# Patient Record
Sex: Male | Born: 1945 | Race: White | Hispanic: No | Marital: Married | Smoking: Former smoker
Health system: Southern US, Community
[De-identification: ages and names within clinical notes are randomized; demographics above are authoritative.]

## PROBLEM LIST (undated history)

## (undated) DIAGNOSIS — R002 Palpitations: Secondary | ICD-10-CM

## (undated) DIAGNOSIS — K219 Gastro-esophageal reflux disease without esophagitis: Secondary | ICD-10-CM

## (undated) DIAGNOSIS — IMO0001 Reserved for inherently not codable concepts without codable children: Secondary | ICD-10-CM

## (undated) DIAGNOSIS — J439 Emphysema, unspecified: Secondary | ICD-10-CM

## (undated) DIAGNOSIS — T7840XA Allergy, unspecified, initial encounter: Secondary | ICD-10-CM

## (undated) DIAGNOSIS — E039 Hypothyroidism, unspecified: Secondary | ICD-10-CM

## (undated) DIAGNOSIS — M810 Age-related osteoporosis without current pathological fracture: Secondary | ICD-10-CM

## (undated) DIAGNOSIS — J45909 Unspecified asthma, uncomplicated: Secondary | ICD-10-CM

## (undated) DIAGNOSIS — E785 Hyperlipidemia, unspecified: Secondary | ICD-10-CM

## (undated) DIAGNOSIS — C801 Malignant (primary) neoplasm, unspecified: Secondary | ICD-10-CM

## (undated) DIAGNOSIS — R011 Cardiac murmur, unspecified: Secondary | ICD-10-CM

## (undated) HISTORY — DX: Emphysema, unspecified: J43.9

## (undated) HISTORY — PX: PROSTATECTOMY: SHX69

## (undated) HISTORY — DX: Cardiac murmur, unspecified: R01.1

## (undated) HISTORY — DX: Palpitations: R00.2

## (undated) HISTORY — DX: Hyperlipidemia, unspecified: E78.5

## (undated) HISTORY — PX: FINGER SURGERY: SHX640

## (undated) HISTORY — PX: COLONOSCOPY: SHX174

## (undated) HISTORY — DX: Allergy, unspecified, initial encounter: T78.40XA

## (undated) HISTORY — PX: OTHER SURGICAL HISTORY: SHX169

## (undated) HISTORY — DX: Age-related osteoporosis without current pathological fracture: M81.0

## (undated) HISTORY — PX: VASECTOMY: SHX75

## (undated) HISTORY — PX: ELBOW SURGERY: SHX618

---

## 2002-07-20 ENCOUNTER — Ambulatory Visit (HOSPITAL_COMMUNITY): Admission: RE | Admit: 2002-07-20 | Discharge: 2002-07-20 | Payer: Self-pay | Admitting: Pulmonary Disease

## 2003-07-22 ENCOUNTER — Ambulatory Visit (HOSPITAL_COMMUNITY): Admission: RE | Admit: 2003-07-22 | Discharge: 2003-07-22 | Payer: Self-pay | Admitting: Cardiology

## 2004-01-20 ENCOUNTER — Ambulatory Visit (HOSPITAL_COMMUNITY): Admission: RE | Admit: 2004-01-20 | Discharge: 2004-01-20 | Payer: Self-pay | Admitting: Internal Medicine

## 2004-07-11 DIAGNOSIS — C4491 Basal cell carcinoma of skin, unspecified: Secondary | ICD-10-CM

## 2004-07-11 HISTORY — DX: Basal cell carcinoma of skin, unspecified: C44.91

## 2005-09-28 ENCOUNTER — Ambulatory Visit (HOSPITAL_COMMUNITY): Admission: RE | Admit: 2005-09-28 | Discharge: 2005-09-28 | Payer: Self-pay | Admitting: Pulmonary Disease

## 2005-10-22 HISTORY — PX: ESOPHAGOGASTRODUODENOSCOPY (EGD) WITH ESOPHAGEAL DILATION: SHX5812

## 2006-04-15 ENCOUNTER — Emergency Department (HOSPITAL_COMMUNITY): Admission: EM | Admit: 2006-04-15 | Discharge: 2006-04-15 | Payer: Self-pay | Admitting: Emergency Medicine

## 2006-04-16 ENCOUNTER — Ambulatory Visit: Payer: Self-pay | Admitting: Internal Medicine

## 2006-04-16 ENCOUNTER — Ambulatory Visit (HOSPITAL_COMMUNITY): Admission: RE | Admit: 2006-04-16 | Discharge: 2006-04-16 | Payer: Self-pay | Admitting: Internal Medicine

## 2007-02-03 ENCOUNTER — Ambulatory Visit (HOSPITAL_COMMUNITY): Admission: RE | Admit: 2007-02-03 | Discharge: 2007-02-03 | Payer: Self-pay | Admitting: Unknown Physician Specialty

## 2007-05-02 ENCOUNTER — Ambulatory Visit (HOSPITAL_COMMUNITY): Admission: RE | Admit: 2007-05-02 | Discharge: 2007-05-02 | Payer: Self-pay | Admitting: Pulmonary Disease

## 2007-06-30 IMAGING — CR DG CHEST 2V
3 series · 3 of 3 positions shown · non-contrast
Comparison: none

HISTORY: Dyspnea, COPD, asthma

CHEST 2 VIEWS:
No prior exams currently available for comparison
Normal heart size, mediastinal contours, and vascularity.
Changes of COPD and bronchitis.
No infiltrate or effusion.
No evidence of pulmonary mass.
Spur formation throughout thoracic spine.

[view not recorded (1 of 3)]
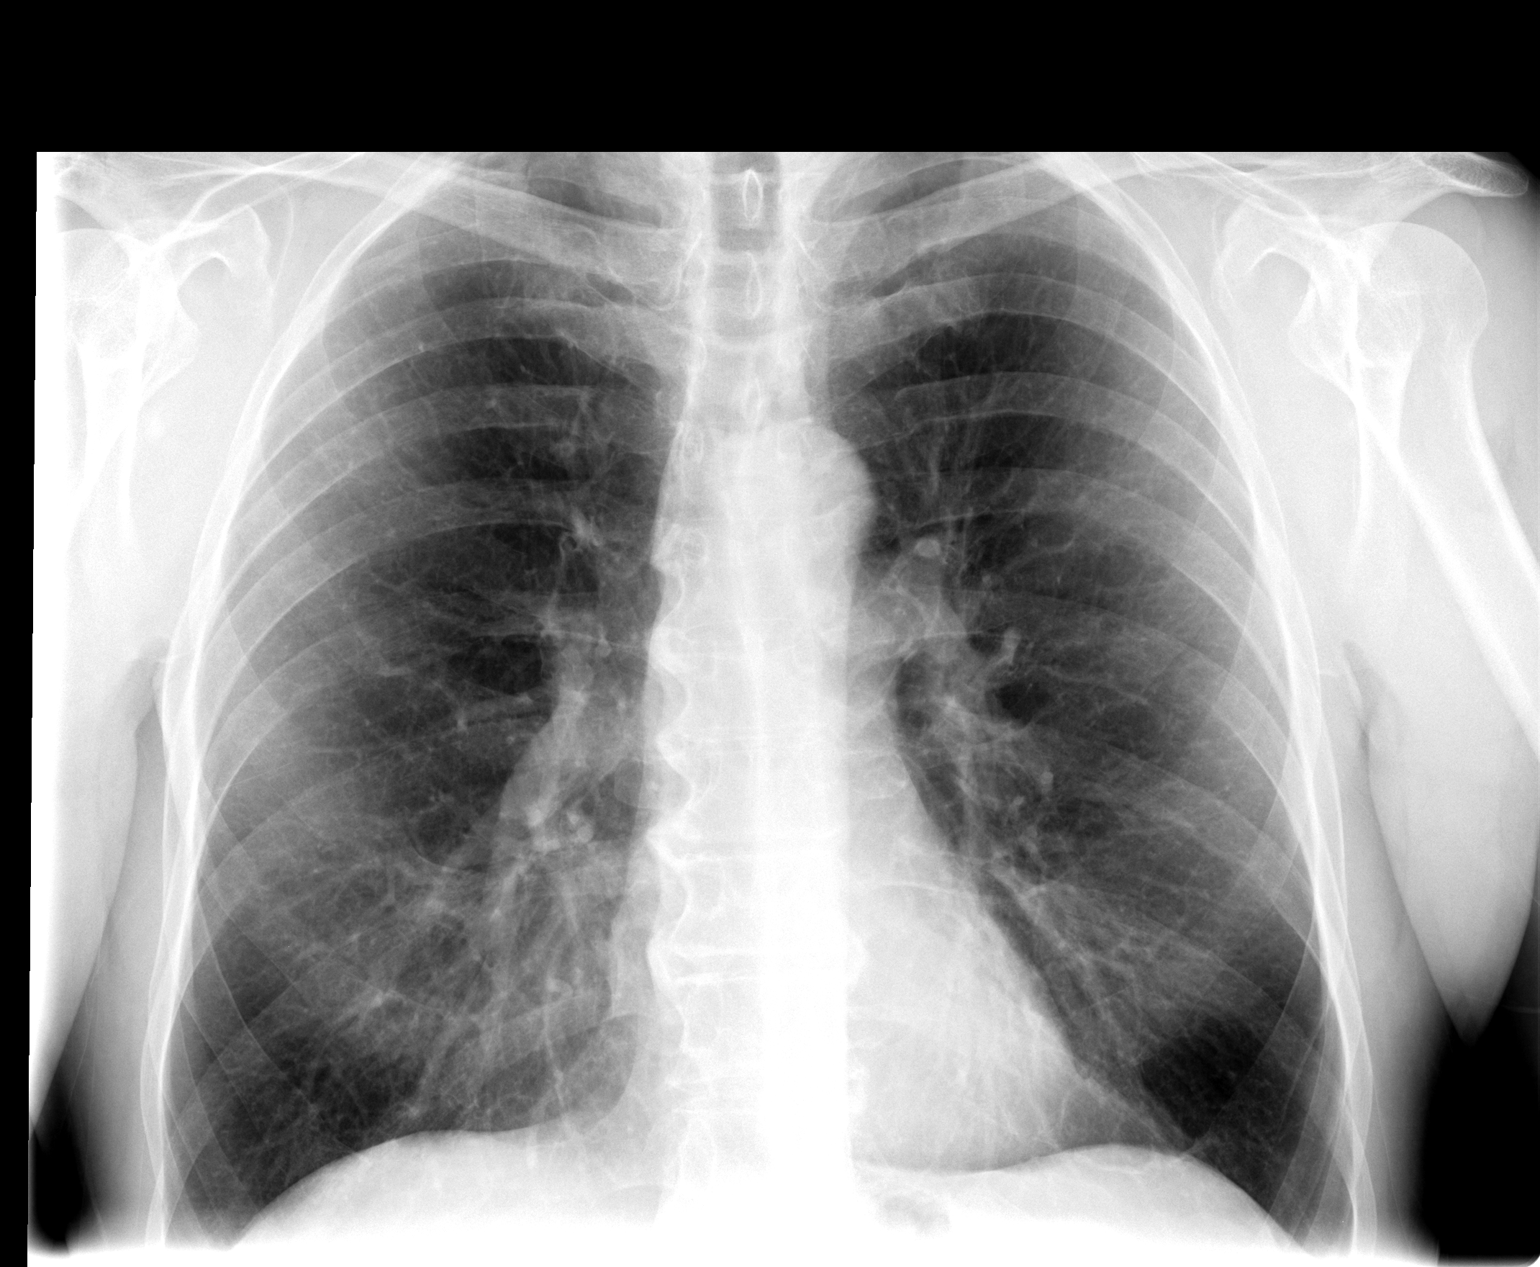

[view not recorded (2 of 3)]
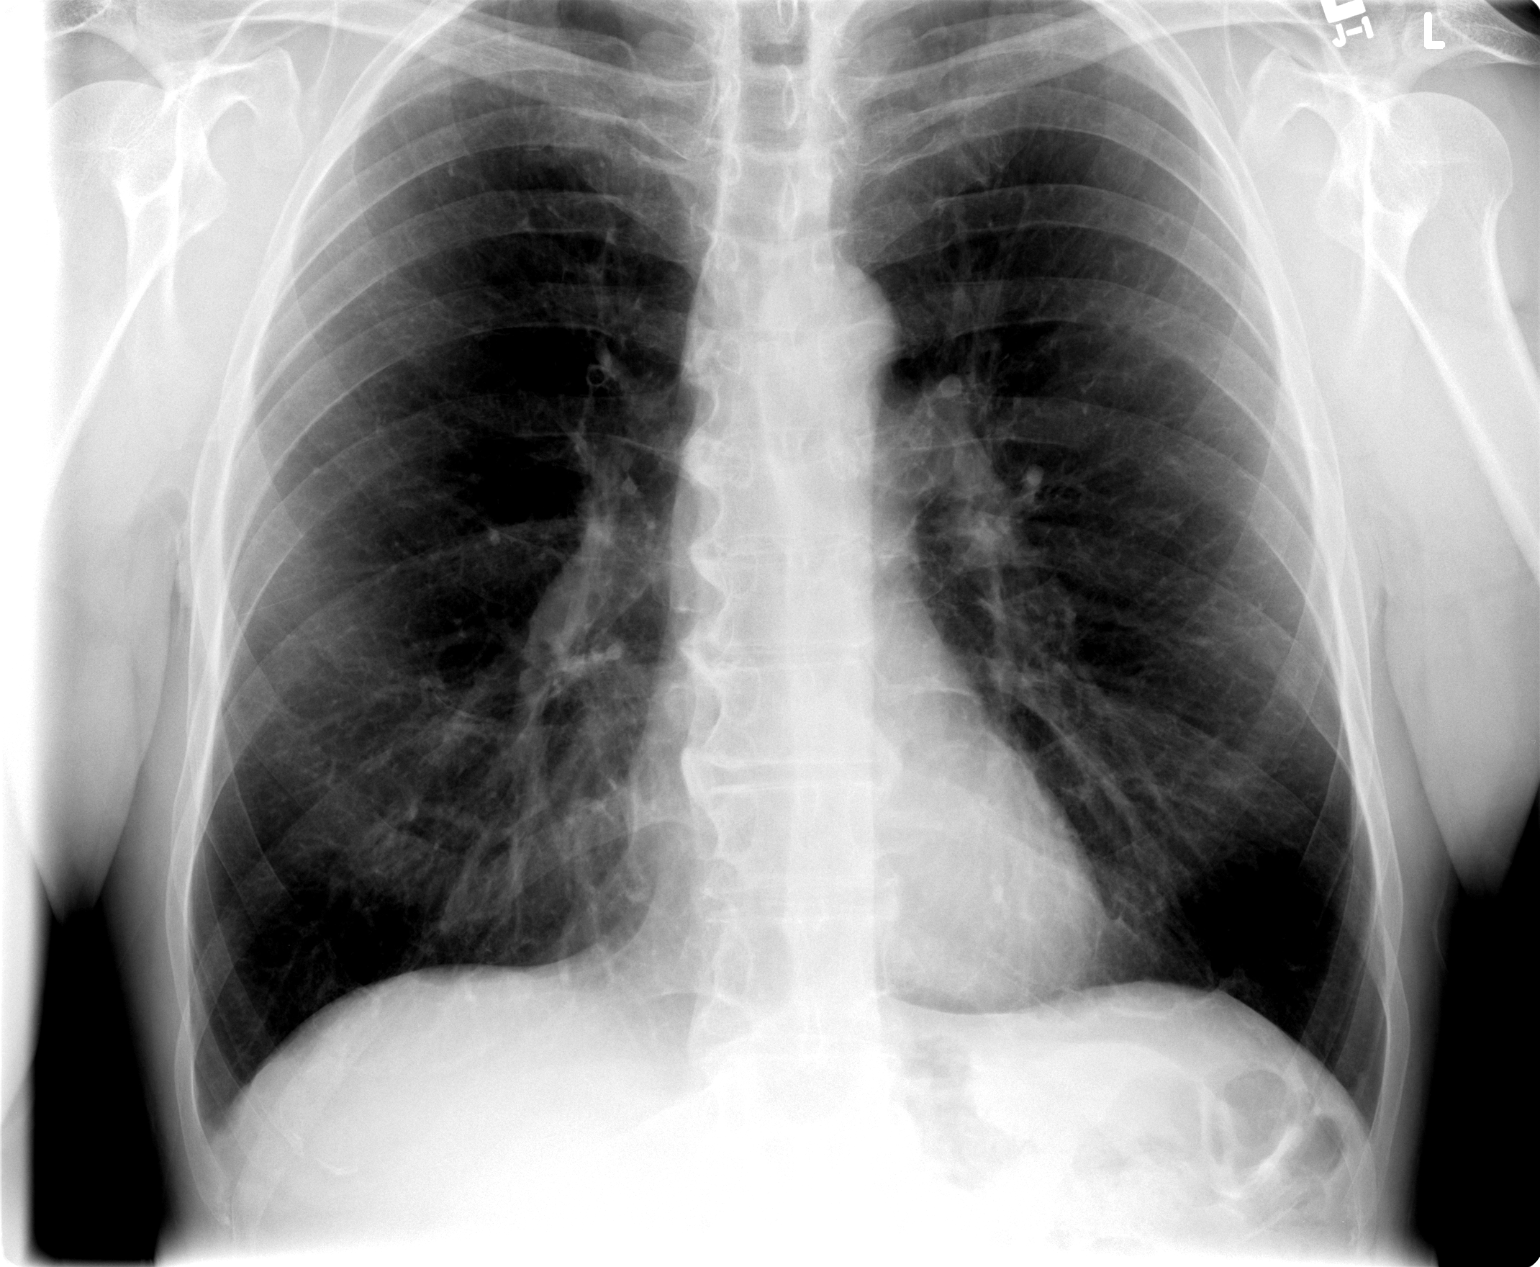

[view not recorded (3 of 3)]
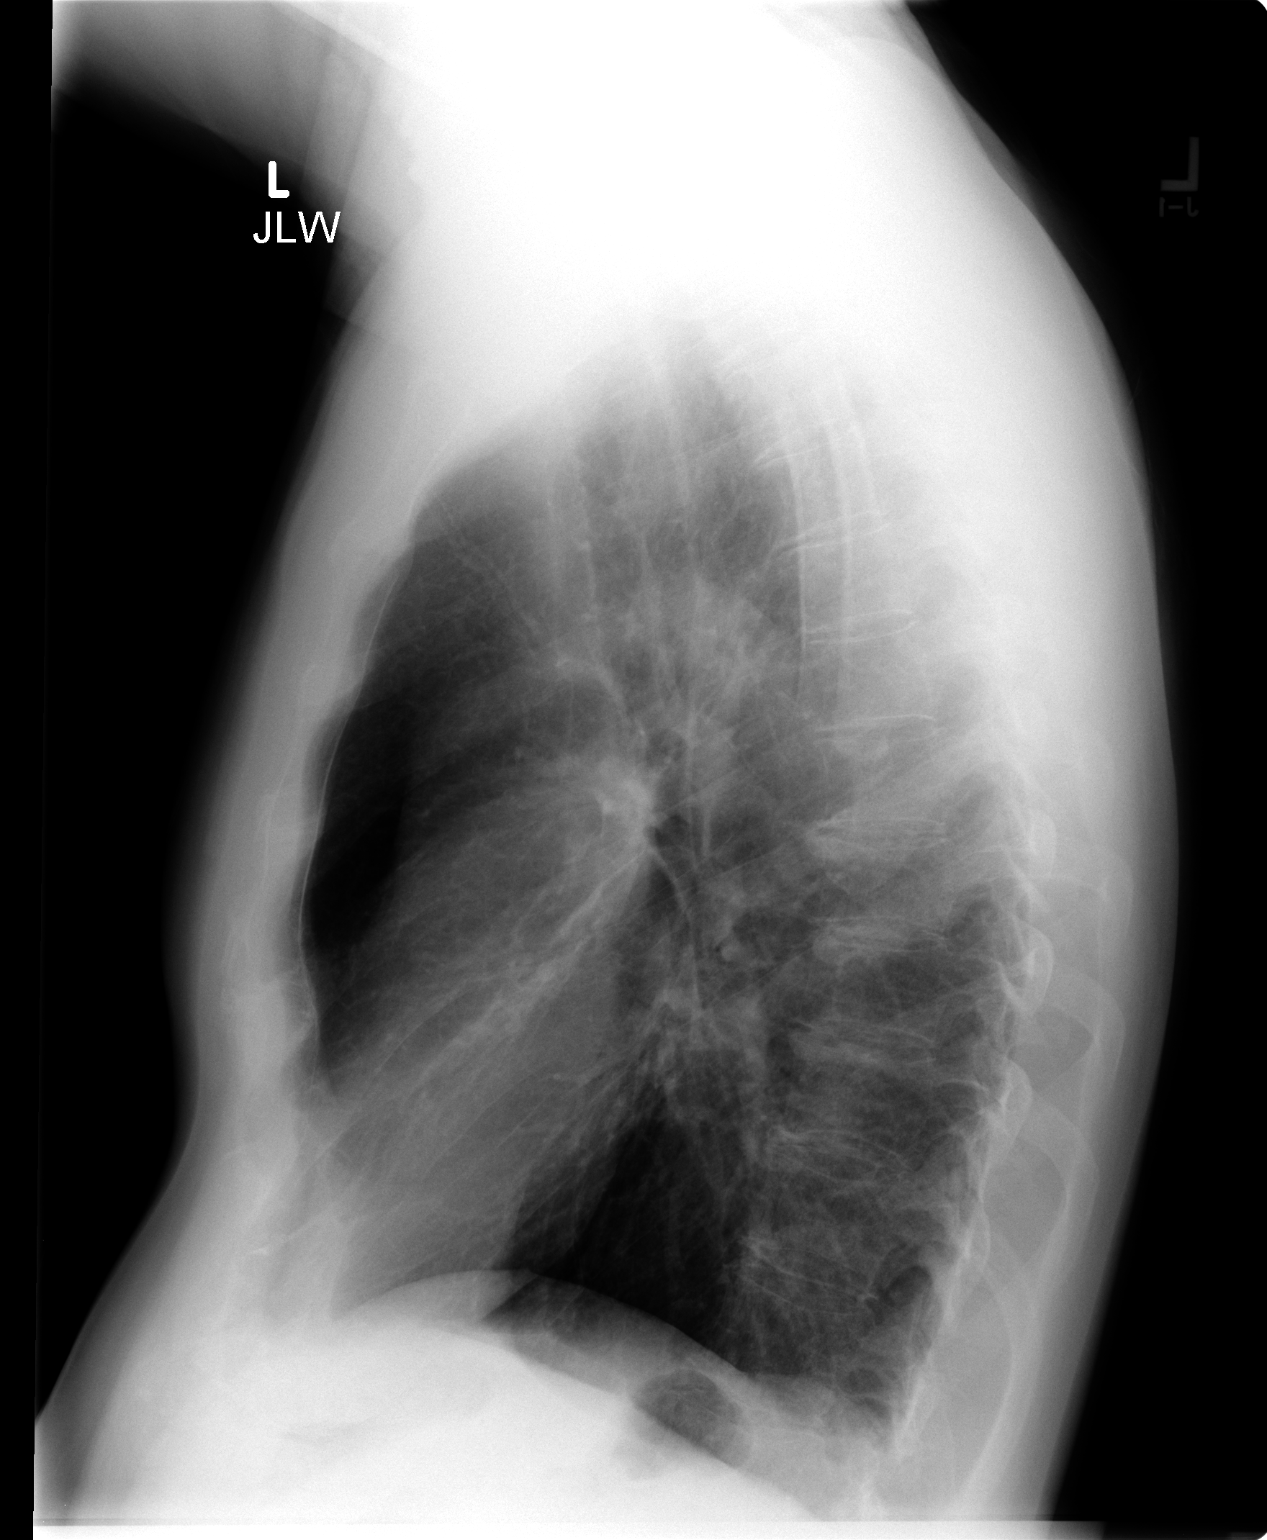

[3 of 3 positions shown; findings below may reference images not displayed]

IMPRESSION: COPD and bronchitic changes without acute abnormality.

## 2007-12-31 ENCOUNTER — Ambulatory Visit (HOSPITAL_COMMUNITY): Admission: RE | Admit: 2007-12-31 | Discharge: 2007-12-31 | Payer: Self-pay | Admitting: Unknown Physician Specialty

## 2009-03-08 ENCOUNTER — Ambulatory Visit: Payer: Self-pay | Admitting: Cardiology

## 2009-03-09 ENCOUNTER — Encounter: Payer: Self-pay | Admitting: Cardiology

## 2009-03-09 ENCOUNTER — Ambulatory Visit: Payer: Self-pay | Admitting: Cardiology

## 2009-03-09 ENCOUNTER — Ambulatory Visit (HOSPITAL_COMMUNITY): Admission: RE | Admit: 2009-03-09 | Discharge: 2009-03-09 | Payer: Self-pay | Admitting: Cardiology

## 2009-03-09 ENCOUNTER — Ambulatory Visit (HOSPITAL_COMMUNITY): Admission: RE | Admit: 2009-03-09 | Discharge: 2009-03-09 | Payer: Self-pay | Admitting: Pulmonary Disease

## 2009-03-14 ENCOUNTER — Telehealth: Payer: Self-pay | Admitting: Cardiology

## 2009-04-01 ENCOUNTER — Ambulatory Visit: Admission: RE | Admit: 2009-04-01 | Discharge: 2009-05-24 | Payer: Self-pay | Admitting: Radiation Oncology

## 2009-04-02 ENCOUNTER — Ambulatory Visit: Payer: Self-pay | Admitting: Cardiology

## 2009-04-05 DIAGNOSIS — E785 Hyperlipidemia, unspecified: Secondary | ICD-10-CM | POA: Insufficient documentation

## 2009-04-05 DIAGNOSIS — Z8639 Personal history of other endocrine, nutritional and metabolic disease: Secondary | ICD-10-CM

## 2009-04-05 DIAGNOSIS — Z8679 Personal history of other diseases of the circulatory system: Secondary | ICD-10-CM | POA: Insufficient documentation

## 2009-04-05 DIAGNOSIS — Z862 Personal history of diseases of the blood and blood-forming organs and certain disorders involving the immune mechanism: Secondary | ICD-10-CM | POA: Insufficient documentation

## 2009-04-06 ENCOUNTER — Ambulatory Visit: Payer: Self-pay | Admitting: Cardiology

## 2009-05-12 ENCOUNTER — Inpatient Hospital Stay (HOSPITAL_COMMUNITY): Admission: RE | Admit: 2009-05-12 | Discharge: 2009-05-14 | Payer: Self-pay | Admitting: Urology

## 2009-05-12 ENCOUNTER — Encounter (INDEPENDENT_AMBULATORY_CARE_PROVIDER_SITE_OTHER): Payer: Self-pay | Admitting: Urology

## 2011-01-28 LAB — HEMOGLOBIN AND HEMATOCRIT, BLOOD
HCT: 38.3 % — ABNORMAL LOW (ref 39.0–52.0)
Hemoglobin: 13.1 g/dL (ref 13.0–17.0)

## 2011-01-28 LAB — BASIC METABOLIC PANEL
Calcium: 9.5 mg/dL (ref 8.4–10.5)
GFR calc Af Amer: >60 mL/min (ref >60)
GFR calc non Af Amer: >60 mL/min (ref >60)
Sodium: 141 mEq/L (ref 135–145)

## 2011-01-28 LAB — TYPE AND SCREEN: Antibody Screen: NEGATIVE

## 2011-01-28 LAB — CBC
Hemoglobin: 14.2 g/dL (ref 13.0–17.0)
RBC: 4.54 MIL/uL (ref 4.22–5.81)
RDW: 13.3 % (ref 11.5–15.5)

## 2011-03-06 NOTE — Discharge Summary (Signed)
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   PROCEDURES:  1. Robotic assisted laparoscopic radical prostatectomy (bilateral      nerve sparing).  2. Bilateral laparoscopic pelvic lymphadenectomy.   HISTORY AND PHYSICAL:  For full details, please see admission history  and physical.  Briefly, [name] is a 65 year old gentleman who was  found to have clinically localized adenocarcinoma of the prostate.  After careful consideration regarding management options for treatment,  he elected to proceed with surgical therapy and a robotic assisted  laparoscopic radical prostatectomy.   HOSPITAL COURSE:  On May 12, 2009, he was taken to the operating room  where he underwent the above-named procedure in which he it tolerated  well and without complications.  Postoperatively, he was able to be  transferred to a regular hospital room following recovery from  anesthesia.  He was able to begin ambulation that evening.  He remained  hemodynamically stable.  His postoperative hematocrit was 38.3.  On the  morning of postoperative day #1, his hematocrit was also found to be  stable at 35.5.  He maintained excellent urine output with minimal  output from his pelvic drain.  Therefore, the pelvic drain was removed.  He was placed on a clear liquid diet and continued to ambulate.  He was  also started on oral pain medications.  He was reevaluated on the  afternoon of  postoperative day #1.  His urine output, blood pressure and  pulse all remained stable.  He was complaining of moderate to severe  right incisional pain not relieved with oral pain medications.  His oral  pain medication was changed, and it was decided that he would stay  overnight for observation of this pain.  On postoperative day #2, he was  reevaluated.  Only mild to moderate pain which was relieved with his  oral pain medications.  Therefore, he was felt to be stable for  discharge, as he had met all discharge criteria.   DISPOSITION:  Home.   DISCHARGE MEDICATIONS:  He was instructed to resume his regular home  medications including:  1. Symbicort.  2. Vitamin D.  3. Niacin.  4. Zetia.  5. Albuterol inhaler.  In addition, he was provided a prescription for:  1. Percocet to take as needed for pain.  2. Told to use Colace as a stool softener.  3. He was given a prescription for Cipro to begin 1 day prior to      return visit for removal of Foley catheter.   DISCHARGE INSTRUCTIONS:  He was instructed to be  ambulatory but  specifically told to refrain from any heavy lifting, strenuous activity  or driving.  He was instructed on routine Foley catheter care and told  to gradually advance his diet over the course of the next several days.   FOLLOW UP:  He will follow up in 1 week for removal of Foley catheter  and skin staples.      Delia Chimes, NP      Heloise Purpura, MD  Electronically Signed    MA/MEDQ  D:  05/17/2009  T:  05/17/2009  Job:  [id number]

## 2011-03-06 NOTE — Op Note (Signed)
NAME:  MARQUIE, ADERHOLD NO.:  [id number]   MEDICAL RECORD NO.:  [id number]          PATIENT TYPE:  INP   LOCATION:  1429                         FACILITY:  [facility]   PHYSICIAN:  Heloise Purpura, MD      DATE OF BIRTH:  1946/02/24   DATE OF PROCEDURE:  05/12/2009  DATE OF DISCHARGE:                               OPERATIVE REPORT   PREOPERATIVE DIAGNOSIS:  Clinically localized adenocarcinoma of the  prostate (clinical stage T1C NX MX).   POSTOPERATIVE DIAGNOSIS:  Clinically localized adenocarcinoma of the  prostate (clinical stage T1C NX MX).   PROCEDURES:  1. Robotic assisted laparoscopic radical prostatectomy (bilateral      nerve sparing).  2. Bilateral laparoscopic pelvic lymphadenectomy.   SURGEON:  Dr. Heloise Purpura.   FIRST ASSISTANT:  Delia Chimes, nurse practitioner.   SECOND ASSISTANT:  Edward Qualia, MD.   ANESTHESIA:  General.   COMPLICATIONS:  None.   ESTIMATED BLOOD LOSS:  150 mL.   SPECIMENS:  1. Prostate and seminal vesicles.  2. Right pelvic lymph nodes.  3. Left pelvic lymph nodes.   DISPOSITION:  Specimens to pathology.   DRAINS:  1. 20-French coude catheter.  2. #19 Blake pelvic drain.   INDICATIONS:  Mr. Ching is a 65 year old gentleman with recently  diagnosed clinically localized adenocarcinoma of the prostate.  After  discussion regarding management options for treatment, he elected to  proceed with surgical therapy and the above procedures.  The potential  risks, complications, and alternative options were discussed in detail  and informed consent was obtained.   DESCRIPTION OF PROCEDURE:  The patient was taken to the operating room  and a general anesthetic was administered.  He was given preoperative  antibiotics, placed in the dorsal lithotomy position, and prepped and  draped in the usual sterile fashion.  Next, a preoperative timeout was  performed.  A Foley catheter was then inserted into the bladder and a  site  was selected just superior to the umbilicus for placement of the  camera port.  This was placed using a standard open Hassan technique  which allowed entry into the peritoneal cavity under direct vision and  without difficulty.  The surgical cart was then docked.  With the aid of  the cautery scissors, the bladder was reflected posteriorly allowing  entry into the space of Retzius and identification of the endopelvic  fascia and prostate.  The endopelvic fascia was then incised from the  apex back to the base of the prostate bilaterally and the underlying  levator muscle fibers were swept laterally off the prostate, thereby  isolating the dorsal venous complex.  The dorsal vein was then stapled  and divided with a 45 mm Flex ETS stapler.  The bladder neck was  identified with the aid of Foley catheter manipulation and was divided  anteriorly, thereby exposing the Foley catheter.  The catheter balloon  was then brought into the operative field after the balloon was deflated  and used to retract the prostate anteriorly.  The posterior bladder neck  was then examined.  There was no evidence of a median lobe.  The  posterior bladder neck was divided and dissection proceeded between the  bladder and prostate until the vasa deferentia and seminal vesicles were  identified.  The vasa deferentia were divided and ligated with Hem-o-lok  clips.  The seminal vesicles were then dissected down to their tips with  care to control the seminal vesicle arterial blood supply with Hem-o-lok  clips.  These structures were then lifted anteriorly and the space  between Denonvilliers fascia and the anterior rectum was bluntly  developed.  This isolated the vascular pedicles of the prostate.  The  lateral prostatic fascia was then incised bilaterally allowing the  neurovascular bundles to be released bilaterally.  The vascular pedicles  of the prostate were then ligated with Hem-o-lok clips above the level  of  the neurovascular bundle and divided with sharp cold scissor  dissection.  The neurovascular bundles were then swept off the apex of  prostate and urethra.  The urethra was sharply transected allowing the  prostate specimen to be disarticulated.  The pelvis was then copiously  irrigated and hemostasis was ensured.  There was no evidence for a  rectal injury.  Attention then turned to the right pelvic sidewall.  The  fibrofatty tissue between the external iliac vein, confluence of the  iliac vessels, hypogastric artery, and Cooper's ligament was dissected  free from the pelvic sidewall with care to preserve the obturator nerve.  Hem-o-lok clips were used for lymphostasis and hemostasis.  An identical  procedure was then performed on the contralateral side and both  lymphatic packets were removed for permanent pathologic analysis.  Attention then turned to the urethral anastomosis.  A 2-0 Vicryl slip-  knot was placed between Denonvilliers fascia, the posterior bladder neck  and the posterior urethra to reapproximate these structures.  A double-  armed 3-0 Monocryl suture was then used to perform a 360 degree running  tension-free anastomosis between the bladder neck and urethra.  A new 20-  [place] coude catheter was inserted into the bladder and irrigated.  The  anastomosis appeared to be watertight and without blood clots within the  bladder.  There was noted to be some oozing from the right neurovascular  bundle and 5 mL of FloSeal was placed into the operative field to help  control this oozing and this did result in excellent hemostasis.  The  pneumoperitoneum was let down and hemostasis continued to be excellent.  A #19 Blake drain was then brought through the left robotic port and  appropriately positioned in the pelvis.  It was secured to the skin with  a nylon suture.  The surgical cart was then undocked.  The right lateral  12 mm port site was closed with a 0 Vicryl suture placed  with the aid of  the suture passer device.  The prostate specimen was then removed intact  within the Endopouch retrieval bag via the periumbilical port site.  All  port sites were then injected with quarter-percent Marcaine and  reapproximated at the skin level with staples.  Sterile dressings were  applied.  He appeared to tolerate the procedure well and without  complications.  He was able to be extubated and transferred to the  recovery unit in satisfactory condition.      Heloise Purpura, MD  Electronically Signed     LB/MEDQ  D:  05/12/2009  T:  05/12/2009  Job:  [id number]

## 2011-03-06 NOTE — Assessment & Plan Note (Signed)
[facility]                       [facility] OFFICE NOTE   TYNER, CODNER                   MRN:          [id number]  DATE:03/08/2009                            DOB:          Mar 06, 1946    CHIEF COMPLAINT:  Palpitations.   HISTORY OF PRESENT ILLNESS:  I was asked by Dr. 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  I evaluated him in 2004 for some dyspnea on exertion.  Please refer to that note July 19, 2003, as well as a 2-D  echocardiogram that was done on July 22, 2003.  The echo showed  mild mitral valve thickening with borderline prolapse, but no  regurgitation.  He has some mild aortic valve sclerosis.  He had normal  right-sided function and normal left-sided function.  At that time,  endocarditis prophylaxis was recommended.   Over the past several weeks, he has noted increased palpitations.  He  had 1 episode one evening.  He thought he had a unhealthy heart.  He  saw Dr. Juanetta Gosling the next day who did an EKG which was normal.  Since  that time, he has cut back on his Symbicort to 1 puff b.i.d. and also  has eliminated wine about 1 glass of night.  He says eliminating the  wine seems to have helped the most.   He is on Synthroid thyroid replacement with a normal TSH recently.  He  takes niacin over the counter, but he takes this in the morning and at  lunch.  He has no side effects from this.   He is still very active biking.  He does not notice palpitations with  exercise.  He has had no presyncope or syncope.  He wears a heart  monitor when he rides and heart rate runs in the mid 90s.   He has a history of exercise-induced asthma and takes Symbicort and  p.r.n. Ventolin.  He does not take much Ventolin however.   PAST MEDICAL HISTORY:   CURRENT MEDICATIONS:  1. Zetia 10 mg a day.  2. Niacin 500 mg b.i.d.  3. Symbicort 160/4.5 one puff  b.i.d.  4. Boniva IV q.3 months.  5. Vitamin D 1000 units b.i.d.  6. Synthroid 200 mcg a day.  7. Ventolin p.r.n.  8. Flonase p.r.n..   As mentioned above, he has a history of asthma and he has had some COPD  changes on chest x-rays.  He is not a smoker.   His previous surgeries could include tennis elbow and trigger finger.  He has also had a vasectomy.   ALLERGIES:  MYCIN DRUGS and STATINS.   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  He enjoys  swimming and biking.  He is married and has 2 children.   FAMILY HISTORY:  Negative for premature coronary artery disease or  sudden death.   REVIEW OF SYSTEMS:  Other than HPI, he wears eyeglasses.  He had an  ulcer some 35 years ago of the stomach.  Otherwise negative other than  Synthroid replacement for hypothyroidism.  He has  mild hyperlipidemia on  Zetia.   The other review of systems are negative.  Please refer to the  diagnostic evaluation form.   PHYSICAL EXAMINATION:  GENERAL:  A very pleasant gentleman in no acute  distress.  Looks physically fit.  He is 6 feet tall, weighs 176 pounds.  VITAL SIGNS:  Blood pressure 120/74 in the right arm, pulse is 57 and  regular.  HEENT:  Normal.  Dentition is in good shape.  NECK:  Carotid upstrokes were equal bilaterally without bruits.  There  is no JVD.  Thyroid is not enlarged.  Trachea is midline.  No  lymphadenopathy.  Neck is supple.  LUNGS:  Clear to auscultation and percussion with no wheezes or rhonchi.  HEART:  A nondisplaced PMI.  He has a split S1, no obvious click, and no  significant murmur, S2 splits physiologically.  ABDOMEN:  Soft, good  bowel sounds.  There is no pulsatile mass.  There is no organomegaly.  EXTREMITIES:  No cyanosis, clubbing, or edema.  Pulses are brisk.  NEURO:  Intact.  No sign of DVT.   His electrocardiogram shows sinus bradycardia with question of left  atrial enlargement.  His PR, QRS, and QTC intervals are normal.   Laboratory data from Dr. Juanetta Gosling office on February 14, 2009, showed a  potassium of 4.3, TSH was normal, and calcium is normal.   ASSESSMENT:  Palpitations, which are most likely benign.  It could be  related to his mild mitral valve prolapse.  He seems to note an  association with wine which I told him was very unusual just 1 glass a  day.  TSH was recently checked and his euthyroid.  He has cut back on  the Symbicort which could have helped as well.  He uses Ventolin very  little.   RECOMMENDATIONS:  A 2-D echocardiogram to reassess his mitral valve  prolapse.  I doubt he has mitral regurgitation.  I have told that he no  longer needs SBE prophylaxis.  I have  reinforced his good healthy therapeutic lifestyle choices.  I will see  him back on a p.r.n. basis if his echo is stable.     Omkar C. Daleen Squibb, MD, [facility]  Electronically Signed    TCW/MedQ  DD: 03/08/2009  DT: 03/09/2009  Job #: [id number]   cc:   Ramon Dredge L. Juanetta Gosling, M.D.

## 2011-03-06 NOTE — Assessment & Plan Note (Signed)
[facility]                       [facility] OFFICE NOTE   Nikolis, Berent LIOR HOEN                   MRN:          [id number]  DATE:04/06/2009                            DOB:          Nov 11, 1945    Walter Taylor comes today to get surgical clearance for a radical  prostatectomy for a Gleason stage 6-7.  This will be robotic and will be  done in [place].  He will require general anesthesia.   He saw me recently for some palpitations and a history of mitral valve  prolapse.  His event recorder which he is still wearing shows mostly  sinus bradycardia going down into the 30s at night.  He is athletic,  rides a bike, and has a lot of vagal tone.  He had significant sinus  arrhythmia which is also sign of healthy vagal tone.  He had occasional  palpitations which correlate with PACs.  There was a rare unusual PVC.  There was no atrial fib or other complex arrhythmia.  A 2-D  echocardiogram basically showed a normal study.  There was no mention of  mitral valve prolapse and the structural integrity of the valve was  normal.  He had trivial regurgitation.  He had normal left ventricular  function as well as normal right-sided function.   He is feeling better overall.  His meds are unchanged, and he is really  only on Zetia 10 mg day, niacin 500 b.i.d., and his Synthroid.  His TSH  has been normal.  He does use Entocort and Ventolin p.r.n. but has tried  to limit the Ventolin.   His exam is blood pressure 124/68, his pulse 60 and regular.  His weight  is 174.  HEENT is normal.  Heart/lungs unchanged.  Abdominal exam is  soft.  Extremities reveal no edema.  Pulses are unchanged.   I had a long talk with Mr. Goddard today.  I have reviewed all of his  event monitor recordings and also have talked to him at length about his  echocardiographic findings.  He is low risk from my standpoint for  surgery.  I will send a copy of this note and go  ahead and approve him  for surgery.  We will see him back in a year or p.r.n.     Skylen C. Daleen Squibb, MD, [facility]  Electronically Signed    TCW/MedQ  DD: 04/06/2009  DT: 04/07/2009  Job #: [id number]   cc:   Heloise Purpura, MD

## 2011-03-09 NOTE — Procedures (Signed)
   NAME:  Walter Taylor, Walter Taylor                      ACCOUNT NO.:  [id number]   MEDICAL RECORD NO.:  [id number]                   PATIENT TYPE:  OUT   LOCATION:  RAD                                  FACILITY:  [facility]   PHYSICIAN:  [place] Bing, M.D.               DATE OF BIRTH:  October 07, 1946   DATE OF PROCEDURE:  07/22/2003  DATE OF DISCHARGE:                                  ECHOCARDIOGRAM   REFERRING PHYSICIANS:  Oneal Deputy. Juanetta Gosling, M.D. and Jesse Sans. Wall, M.D.   CLINICAL DATA:  A 65 year old gentleman with hypertension.   M-MODE:  AORTA:  2.7  (<4.0)  LEFT ATRIUM:  4.1  (<4.0)  SEPTUM:  1.3  (0.7-1.1)  POSTERIOR WALL:  0.9  (0.7-1.1)  LV-DIASTOLE:  4.4  (<5.7)  LV-SYSTOLE:  2.9  (<4.0)   FINDINGS/IMPRESSION:  1. A technically adequate echocardiographic study.  2. Left atrial size at the upper limit of normal; normal right atrium and     right ventricle.  3. Mild mitral valve thickening with borderline prolapse and mild annular     calcification, but no regurgitation.  4. Mild aortic valvular sclerosis.  5. Normal tricuspid and pulmonic valve; physiologic tricuspid regurgitation;     normal estimated RV systolic pressure.  6. Normal internal dimension of the left ventricle; borderline LVH most     prominent in the septum.  Normal regional and global LV systolic     function.  7. Mild IVC dilatation, borderline decrease in IVC diameter with     inspiration.      ___________________________________________                                            [place] Bing, M.D.   RR/MEDQ  D:  07/22/2003  T:  07/23/2003  Job:  [id number]

## 2011-03-09 NOTE — Op Note (Signed)
NAME:  Walter Taylor, Walter Taylor                      ACCOUNT NO.:  [id number]   MEDICAL RECORD NO.:  [id number]                   PATIENT TYPE:  AMB   LOCATION:  DAY                                  FACILITY:  [facility]   PHYSICIAN:  Lionel December, M.D.                 DATE OF BIRTH:  November 27, 1945   DATE OF PROCEDURE:  01/20/2004  DATE OF DISCHARGE:                                 OPERATIVE REPORT   PROCEDURE:  Total colonoscopy.   INDICATIONS FOR PROCEDURE:  Walter Taylor is a 65 year old Caucasian male who is here  for screening colonoscopy.  Family history is negative for colorectal  carcinoma.  The procedure and risks were reviewed with the patient, and informed consent  was obtained.   PREOPERATIVE MEDICATIONS:  Demerol 25 mg IV, Versed 4 mg IV in divided dose.   FINDINGS:  The procedure was performed in the endoscopy suite.  The  patient's vital signs and O2 saturations were monitored during the procedure  and remained stable.  The patient was placed in the left lateral recumbent  position and rectal examination performed.  No abnormality noted on external  or digital exam.  The Olympus videoscope was placed into the rectum and  advanced into the region of the sigmoid colon and beyond.  Somewhat  redundant colon.  The scope was passed to the cecum which was identified by  the appendiceal orifice and ileocecal valve.  Pictures were taken for the  record.  As the scope was withdrawn, the colonic mucosa was carefully  examined.  There were a few tiny diverticula at the ascending colon along  with a few more at the sigmoid.  There were no polyps and/or tumor masses.  The rectal mucosa was normal.  The scope was retroflexed to examine the  anorectal junction which was unremarkable.  The endoscope was straightened  and withdrawn.  The patient tolerated the procedure well.   FINAL DIAGNOSIS:  A few small diverticula at ascending and sigmoid colon.  Otherwise normal colonoscopy.   RECOMMENDATIONS:  1.  High fiber diet.  2. He will continue yearly Hemoccults and consider next screening exam in 10     years from now.      ___________________________________________                                            Lionel December, M.D.   NR/MEDQ  D:  01/20/2004  T:  01/20/2004  Job:  [id number]   cc:   Ramon Dredge L. Juanetta Gosling, M.D.  [address]  [place]  [place] [address]  Fax: [phone]

## 2011-03-09 NOTE — Procedures (Signed)
   NAME:  Walter Taylor, Walter Taylor                      ACCOUNT NO.:  [id number]   MEDICAL RECORD NO.:  [id number]                   PATIENT TYPE:  OUT   LOCATION:  RAD                                  FACILITY:  [facility]   PHYSICIAN:  Edward L. Juanetta Gosling, M.D.             DATE OF BIRTH:  Sep 20, 1946   DATE OF PROCEDURE:  07/20/2002  DATE OF DISCHARGE:                              PULMONARY FUNCTION TEST   IMPRESSION:  1. Spirometry shows no evidence of ventilatory defect but does show airflow     obstruction at the level of the small airways.                                               Edward L. Juanetta Gosling, M.D.    ELH/MEDQ  D:  07/20/2002  T:  07/21/2002  Job:  [id number]

## 2011-03-09 NOTE — Op Note (Signed)
NAME:  Walter Taylor, ORIORDAN            ACCOUNT NO.:  [id number]   MEDICAL RECORD NO.:  [id number]          PATIENT TYPE:  AMB   LOCATION:  DAY                           FACILITY:  [facility]   PHYSICIAN:  Lionel December, M.D.    DATE OF BIRTH:  02-Mar-1946   DATE OF PROCEDURE:  04/16/2006  DATE OF DISCHARGE:                                 OPERATIVE REPORT   PROCEDURE:  Esophagogastroduodenoscopy with esophageal dilation.   INDICATIONS:  [name] is a 65 year old Caucasian male who an episode of food  impaction 4 weeks ago with spontaneous relief.  He had another episode 2  days ago while he was eating fried chicken.  He came to emergency room.  After waiting 1 day.  He was treated and discharged.  He called our office  yesterday afternoon stating he still felt he had foreign body esophagus.  This morning he feels better.  He feels that he may have residual foreign  body in his esophagus, but he has been tolerating liquids.  He denies  heartburn.  Procedure and risks were reviewed with the patient and informed  consent was obtained.   MEDS FOR CONSCIOUS SEDATION:  Benzocaine spray for pharyngeal topical  anesthesia.  Demerol 50 mg IV Versed 7 mg IV.   FINDINGS:  Procedure performed in endoscopy suite.  The patient's vital  signs and O2 saturations were monitored during the procedure and remained  stable.  The patient was placed in the left lateral recumbent position and  Olympus videoscope was passed via oropharynx without any difficulty into  esophagus.   ESOPHAGUS:  Mucosa of the esophagus was normal.  GE junction was at 43-44 cm  from the incisors; and no ring or stricture was noted.  GE junction was at  44 cm from the incisors.  There was no ring or stricture noted.   STOMACH:  It was empty and distended very well insufflation.  Folds in the  proximal stomach were normal.  Examination of the mucosa at body, antrum,  pyloric channel as well as angularis, fundus, and cardia was normal.   DUODENUM:  Bulbar mucosa was normal.  Scope was passed into the second part  of the duodenum where mucosa and folds were normal.  Endoscope was  withdrawn.   ESOPHAGUS:  Esophagus was dilated by passing 56-French Maloney dilator to  full insertion.  As the dilator was withdrawn endoscope was passed, again,  and esophagus reexamined.  There was no mucosal disruption.  Endoscope was  withdrawn.  The patient tolerated the procedure well.   FINAL DIAGNOSIS:  Normal esophagogastroduodenoscopy.   ESOPHAGUS:  Dilated by passing 56-French Maloney dilator given history of  intermittent solid food dysphagia.   RECOMMENDATIONS:  The patient advised to chew his for food thoroughly before  he swallows it.   He will need further evaluation should he experience dysphagia, again, with  barium pill study and esophageal manometry.  This is the end of note.      Lionel December, M.D.  Electronically Signed     NR/MEDQ  D:  04/16/2006  T:  04/16/2006  Job:  [id number]  cc:   Ramon Dredge L. Juanetta Gosling, M.D.  Fax: [phone]

## 2011-06-12 ENCOUNTER — Ambulatory Visit (HOSPITAL_COMMUNITY)
Admission: RE | Admit: 2011-06-12 | Discharge: 2011-06-12 | Disposition: A | Discharge status: Home / Self Care | Payer: 59 | Source: Ambulatory Visit | Attending: Pulmonary Disease | Admitting: Pulmonary Disease

## 2011-06-12 ENCOUNTER — Other Ambulatory Visit (HOSPITAL_COMMUNITY): Payer: Self-pay | Admitting: Pulmonary Disease

## 2011-06-12 DIAGNOSIS — J45909 Unspecified asthma, uncomplicated: Secondary | ICD-10-CM | POA: Insufficient documentation

## 2011-10-09 ENCOUNTER — Encounter: Payer: Self-pay | Admitting: Cardiology

## 2011-11-05 ENCOUNTER — Other Ambulatory Visit (HOSPITAL_COMMUNITY): Payer: Self-pay | Admitting: Pulmonary Disease

## 2011-11-05 ENCOUNTER — Ambulatory Visit (HOSPITAL_COMMUNITY)
Admission: RE | Admit: 2011-11-05 | Discharge: 2011-11-05 | Disposition: A | Discharge status: Home / Self Care | Payer: Medicare Other | Source: Ambulatory Visit | Attending: Pulmonary Disease | Admitting: Pulmonary Disease

## 2011-11-05 DIAGNOSIS — R059 Cough, unspecified: Secondary | ICD-10-CM

## 2011-11-05 DIAGNOSIS — J189 Pneumonia, unspecified organism: Secondary | ICD-10-CM | POA: Insufficient documentation

## 2011-11-05 DIAGNOSIS — F17200 Nicotine dependence, unspecified, uncomplicated: Secondary | ICD-10-CM | POA: Insufficient documentation

## 2011-11-05 DIAGNOSIS — R05 Cough: Secondary | ICD-10-CM

## 2012-01-10 ENCOUNTER — Ambulatory Visit (HOSPITAL_COMMUNITY)
Admission: RE | Admit: 2012-01-10 | Discharge: 2012-01-10 | Disposition: A | Discharge status: Home / Self Care | Payer: Medicare Other | Source: Ambulatory Visit | Attending: Pulmonary Disease | Admitting: Pulmonary Disease

## 2012-01-10 ENCOUNTER — Other Ambulatory Visit (HOSPITAL_COMMUNITY): Payer: Self-pay | Admitting: Pulmonary Disease

## 2012-01-10 DIAGNOSIS — J449 Chronic obstructive pulmonary disease, unspecified: Secondary | ICD-10-CM | POA: Insufficient documentation

## 2012-01-10 DIAGNOSIS — J189 Pneumonia, unspecified organism: Secondary | ICD-10-CM

## 2012-01-10 DIAGNOSIS — J4489 Other specified chronic obstructive pulmonary disease: Secondary | ICD-10-CM | POA: Insufficient documentation

## 2013-07-07 ENCOUNTER — Other Ambulatory Visit: Payer: Self-pay | Admitting: Dermatology

## 2013-07-07 DIAGNOSIS — D099 Carcinoma in situ, unspecified: Secondary | ICD-10-CM

## 2013-07-07 HISTORY — DX: Carcinoma in situ, unspecified: D09.9

## 2014-06-07 ENCOUNTER — Other Ambulatory Visit: Payer: Self-pay | Admitting: Dermatology

## 2014-06-07 DIAGNOSIS — C4492 Squamous cell carcinoma of skin, unspecified: Secondary | ICD-10-CM

## 2014-06-07 HISTORY — DX: Squamous cell carcinoma of skin, unspecified: C44.92

## 2014-08-12 ENCOUNTER — Other Ambulatory Visit: Payer: Self-pay | Admitting: Dermatology

## 2014-11-19 ENCOUNTER — Encounter (INDEPENDENT_AMBULATORY_CARE_PROVIDER_SITE_OTHER): Payer: Self-pay | Admitting: *Deleted

## 2014-11-22 ENCOUNTER — Telehealth (INDEPENDENT_AMBULATORY_CARE_PROVIDER_SITE_OTHER): Payer: Self-pay | Admitting: *Deleted

## 2014-11-22 NOTE — Telephone Encounter (Signed)
Patient called to see when his last TCS was and wanted to get one scheduled. Patient's call was return and advised he would get a letter from [place] with the information needed to get his TCS scheduled. Last TCS was 01/20/04.

## 2014-11-22 NOTE — Telephone Encounter (Signed)
Letter was mailed to patient on 11/19/14 from referral from PCP

## 2014-12-01 ENCOUNTER — Other Ambulatory Visit (INDEPENDENT_AMBULATORY_CARE_PROVIDER_SITE_OTHER): Payer: Self-pay | Admitting: *Deleted

## 2014-12-01 ENCOUNTER — Encounter (INDEPENDENT_AMBULATORY_CARE_PROVIDER_SITE_OTHER): Payer: Self-pay | Admitting: *Deleted

## 2014-12-01 ENCOUNTER — Telehealth (INDEPENDENT_AMBULATORY_CARE_PROVIDER_SITE_OTHER): Payer: Self-pay | Admitting: *Deleted

## 2014-12-01 DIAGNOSIS — Z1211 Encounter for screening for malignant neoplasm of colon: Secondary | ICD-10-CM

## 2014-12-01 NOTE — Telephone Encounter (Signed)
Patient needs movi prep 

## 2014-12-07 ENCOUNTER — Telehealth (INDEPENDENT_AMBULATORY_CARE_PROVIDER_SITE_OTHER): Payer: Self-pay | Admitting: *Deleted

## 2014-12-07 NOTE — Telephone Encounter (Signed)
Referring MD/PCP: hawkins   Procedure: tcs  Reason/Indication:  screening  Has patient had this procedure before?  Yes, 10 yrs ago  If so, when, by whom and where?    Is there a family history of colon cancer?  no  Who?  What age when diagnosed?    Is patient diabetic?   no      Does patient have prosthetic heart valve?  no  Do you have a pacemaker?  no  Has patient ever had endocarditis? no  Has patient had joint replacement within last 12 months?  no  Does patient tend to be constipated or take laxatives? no  Is patient on Coumadin, Plavix and/or Aspirin? no  Medications: zetia 10 mg daily, synthroid 150 mcg daily, vit d3 5,000 units daily, arcapta inhaler daily  Allergies: see epic  Medication Adjustment:   Procedure date & time: 01/06/15 at 1030

## 2014-12-08 MED ORDER — PEG-KCL-NACL-NASULF-NA ASC-C 100 G PO SOLR: 1.0000 | Freq: Once | ORAL | Status: DC

## 2014-12-08 NOTE — Telephone Encounter (Signed)
agree

## 2014-12-10 ENCOUNTER — Encounter (INDEPENDENT_AMBULATORY_CARE_PROVIDER_SITE_OTHER): Payer: Self-pay | Admitting: *Deleted

## 2015-01-06 ENCOUNTER — Encounter (HOSPITAL_COMMUNITY)
Admission: RE | Disposition: A | Discharge status: Home / Self Care | Payer: Self-pay | Source: Ambulatory Visit | Attending: Internal Medicine

## 2015-01-06 ENCOUNTER — Ambulatory Visit (HOSPITAL_COMMUNITY)
Admission: RE | Admit: 2015-01-06 | Discharge: 2015-01-06 | Disposition: A | Discharge status: Home / Self Care | Payer: Medicare Other | Source: Ambulatory Visit | Attending: Internal Medicine | Admitting: Internal Medicine

## 2015-01-06 ENCOUNTER — Encounter (HOSPITAL_COMMUNITY): Payer: Self-pay | Admitting: *Deleted

## 2015-01-06 DIAGNOSIS — Z87891 Personal history of nicotine dependence: Secondary | ICD-10-CM | POA: Insufficient documentation

## 2015-01-06 DIAGNOSIS — K644 Residual hemorrhoidal skin tags: Secondary | ICD-10-CM | POA: Insufficient documentation

## 2015-01-06 DIAGNOSIS — K5730 Diverticulosis of large intestine without perforation or abscess without bleeding: Secondary | ICD-10-CM | POA: Diagnosis not present

## 2015-01-06 DIAGNOSIS — K648 Other hemorrhoids: Secondary | ICD-10-CM

## 2015-01-06 DIAGNOSIS — E039 Hypothyroidism, unspecified: Secondary | ICD-10-CM | POA: Insufficient documentation

## 2015-01-06 DIAGNOSIS — E785 Hyperlipidemia, unspecified: Secondary | ICD-10-CM | POA: Diagnosis not present

## 2015-01-06 DIAGNOSIS — Z8546 Personal history of malignant neoplasm of prostate: Secondary | ICD-10-CM | POA: Insufficient documentation

## 2015-01-06 DIAGNOSIS — Z79899 Other long term (current) drug therapy: Secondary | ICD-10-CM | POA: Diagnosis not present

## 2015-01-06 DIAGNOSIS — K219 Gastro-esophageal reflux disease without esophagitis: Secondary | ICD-10-CM | POA: Diagnosis not present

## 2015-01-06 DIAGNOSIS — Z1211 Encounter for screening for malignant neoplasm of colon: Secondary | ICD-10-CM | POA: Insufficient documentation

## 2015-01-06 HISTORY — PX: COLONOSCOPY: SHX5424

## 2015-01-06 HISTORY — DX: Malignant (primary) neoplasm, unspecified: C80.1

## 2015-01-06 HISTORY — DX: Hypothyroidism, unspecified: E03.9

## 2015-01-06 HISTORY — DX: Gastro-esophageal reflux disease without esophagitis: K21.9

## 2015-01-06 HISTORY — DX: Reserved for inherently not codable concepts without codable children: IMO0001

## 2015-01-06 HISTORY — DX: Unspecified asthma, uncomplicated: J45.909

## 2015-01-06 SURGERY — COLONOSCOPY
Anesthesia: Moderate Sedation

## 2015-01-06 MED ORDER — MIDAZOLAM HCL 5 MG/5ML IJ SOLN
INTRAMUSCULAR | Status: DC | PRN
  Administered 2015-01-06: 2 mg via INTRAVENOUS
  Administered 2015-01-06: 1 mg via INTRAVENOUS
  Administered 2015-01-06: 2 mg via INTRAVENOUS

## 2015-01-06 MED ORDER — MEPERIDINE HCL 50 MG/ML IJ SOLN
INTRAMUSCULAR | Status: AC
  Filled 2015-01-06: qty 1

## 2015-01-06 MED ORDER — MIDAZOLAM HCL 5 MG/5ML IJ SOLN
INTRAMUSCULAR | Status: AC
  Filled 2015-01-06: qty 10

## 2015-01-06 MED ORDER — SODIUM CHLORIDE 0.9 % IV SOLN
INTRAVENOUS | Status: DC
  Administered 2015-01-06: 09:00:00 via INTRAVENOUS

## 2015-01-06 MED ORDER — MEPERIDINE HCL 50 MG/ML IJ SOLN
INTRAMUSCULAR | Status: DC | PRN
  Administered 2015-01-06 (×2): 25 mg

## 2015-01-06 NOTE — Discharge Instructions (Addendum)
Resume usual medications and high fiber diet. °No driving for 24 hours. °Next screening exam in 10 years. ° °Colonoscopy, Care After °These instructions give you information on caring for yourself after your procedure. Your doctor may also give you more specific instructions. Call your doctor if you have any problems or questions after your procedure. °HOME CARE °· Do not drive for 24 hours. °· Do not sign important papers or use machinery for 24 hours. °· You may shower. °· You may go back to your usual activities, but go slower for the first 24 hours. °· Take rest breaks often during the first 24 hours. °· Walk around or use warm packs on your belly (abdomen) if you have belly cramping or gas. °· Drink enough fluids to keep your pee (urine) clear or pale yellow. °· Resume your normal diet. Avoid heavy or fried foods. °· Avoid drinking alcohol for 24 hours or as told by your doctor. °· Only take medicines as told by your doctor. °If a tissue sample (biopsy) was taken during the procedure:  °· Do not take aspirin or blood thinners for 7 days, or as told by your doctor. °· Do not drink alcohol for 7 days, or as told by your doctor. °· Eat soft foods for the first 24 hours. °GET HELP IF: °You still have a small amount of blood in your poop (stool) 2-3 days after the procedure. °GET HELP RIGHT AWAY IF: °· You have more than a small amount of blood in your poop. °· You see clumps of tissue (blood clots) in your poop. °· Your belly is puffy (swollen). °· You feel sick to your stomach (nauseous) or throw up (vomit). °· You have a fever. °· You have belly pain that gets worse and medicine does not help. °MAKE SURE YOU: °· Understand these instructions. °· Will watch your condition. °· Will get help right away if you are not doing well or get worse. °Document Released: 11/10/2010 Document Revised: 10/13/2013 Document Reviewed: 06/15/2013 °ExitCare® Patient Information ©2015 [employer]. This information is not intended to  replace advice given to you by your health care provider. Make sure you discuss any questions you have with your health care provider. °High-Fiber Diet °Fiber is found in fruits, vegetables, and grains. A high-fiber diet encourages the addition of more whole grains, legumes, fruits, and vegetables in your diet. The recommended amount of fiber for adult males is 38 g per day. For adult females, it is 25 g per day. Pregnant and lactating women should get 28 g of fiber per day. If you have a digestive or bowel problem, ask your caregiver for advice before adding high-fiber foods to your diet. Eat a variety of high-fiber foods instead of only a select few type of foods.  °PURPOSE °· To increase stool bulk. °· To make bowel movements more regular to prevent constipation. °· To lower cholesterol. °· To prevent overeating. °WHEN IS THIS DIET USED? °· It may be used if you have constipation and hemorrhoids. °· It may be used if you have uncomplicated diverticulosis (intestine condition) and irritable bowel syndrome. °· It may be used if you need help with weight management. °· It may be used if you want to add it to your diet as a protective measure against atherosclerosis, diabetes, and cancer. °SOURCES OF FIBER °· Whole-grain breads and cereals. °· Fruits, such as apples, oranges, bananas, berries, prunes, and pears. °· Vegetables, such as green peas, carrots, sweet potatoes, beets, broccoli, cabbage, spinach, and   artichokes. °· Legumes, such split peas, soy, lentils. °· Almonds. °FIBER CONTENT IN FOODS °Starches and Grains / Dietary Fiber (g) °· Cheerios, 1 cup / 3 g °· Corn Flakes cereal, 1 cup / 0.7 g °· Rice crispy treat cereal, 1¼ cup / 0.3 g °· Instant oatmeal (cooked), ½ cup / 2 g °· Frosted wheat cereal, 1 cup / 5.1 g °· Brown, long-grain rice (cooked), 1 cup / 3.5 g °· White, long-grain rice (cooked), 1 cup / 0.6 g °· Enriched macaroni (cooked), 1 cup / 2.5 g °Legumes / Dietary Fiber (g) °· Baked beans (canned,  plain, or vegetarian), ½ cup / 5.2 g °· Kidney beans (canned), ½ cup / 6.8 g °· Pinto beans (cooked), ½ cup / 5.5 g °Breads and Crackers / Dietary Fiber (g) °· Plain or honey graham crackers, 2 squares / 0.7 g °· Saltine crackers, 3 squares / 0.3 g °· Plain, salted pretzels, 10 pieces / 1.8 g °· Whole-wheat bread, 1 slice / 1.9 g °· White bread, 1 slice / 0.7 g °· Raisin bread, 1 slice / 1.2 g °· Plain bagel, 3 oz / 2 g °· Flour tortilla, 1 oz / 0.9 g °· Corn tortilla, 1 small / 1.5 g °· Hamburger or hotdog bun, 1 small / 0.9 g °Fruits / Dietary Fiber (g) °· Apple with skin, 1 medium / 4.4 g °· Sweetened applesauce, ½ cup / 1.5 g °· Banana, ½ medium / 1.5 g °· Grapes, 10 grapes / 0.4 g °· Orange, 1 small / 2.3 g °· Raisin, 1.5 oz / 1.6 g °· Melon, 1 cup / 1.4 g °Vegetables / Dietary Fiber (g) °· Green beans (canned), ½ cup / 1.3 g °· Carrots (cooked), ½ cup / 2.3 g °· Broccoli (cooked), ½ cup / 2.8 g °· Peas (cooked), ½ cup / 4.4 g °· Mashed potatoes, ½ cup / 1.6 g °· Lettuce, 1 cup / 0.5 g °· Corn (canned), ½ cup / 1.6 g °· Tomato, ½ cup / 1.1 g °Document Released: 10/08/2005 Document Revised: 04/08/2012 Document Reviewed: 01/10/2012 °ExitCare® Patient Information ©2015 [employer]. This information is not intended to replace advice given to you by your health care provider. Make sure you discuss any questions you have with your health care provider. ° °

## 2015-01-06 NOTE — H&P (Signed)
Walter Taylor is an 70 y.o. male.   Chief Complaint: Patient is here for colonoscopy. HPI: Patient is 69 year old Caucasian male who is here for average risk screening colonoscopy. His last exams in March 2005. He denies abdominal pain change in bowel habits or rectal bleeding. Family history is negative for CRC.  Past Medical History  Diagnosis Date  . Palpitations     Hx of  . Hyperlipidemia   . Hypothyroidism   . Asthma   . Shortness of breath dyspnea   . GERD (gastroesophageal reflux disease)     occasionally  . Cancer     Prostate cancer    Past Surgical History  Procedure Laterality Date  . Finger surgery      Trigger finger release  . Vasectomy    . Elbow surgery      Tennis elbow  . Colonoscopy    . Left shoulder reconstruction    . Prostatectomy      History reviewed. No pertinent family history. Social History:  reports that he has quit smoking. His smoking use included Cigarettes. He has a 7.5 pack-year smoking history. He does not have any smokeless tobacco history on file. He reports that he drinks about 2.4 oz of alcohol per week. He reports that he does not use illicit drugs.  Allergies:  Allergies  Allergen Reactions  . Erythromycin   . Nsaids   . Statins     Medications Prior to Admission  Medication Sig Dispense Refill  . Cholecalciferol (VITAMIN D3) 5000 UNITS TABS Take 5,000 Units by mouth daily.    Marland Kitchen ezetimibe (ZETIA) 10 MG tablet Take 10 mg by mouth.      . famotidine (PEPCID) 20 MG tablet Take 20 mg by mouth daily.    . Indacaterol Maleate 75 MCG CAPS Place 1 puff into inhaler and inhale daily.    Marland Kitchen levothyroxine (SYNTHROID, LEVOTHROID) 200 MCG tablet Take 200 mcg by mouth.      . peg 3350 powder (MOVIPREP) 100 G SOLR Take 1 kit (200 g total) by mouth once. 1 kit 0  . Red Yeast Rice Extract (RED YEAST RICE PO) Take 1 capsule by mouth daily.      No results found for this or any previous visit (from the past 48 hour(s)). No results  found.  ROS  Blood pressure 133/72, pulse 57, temperature 97.5 F (36.4 C), temperature source Oral, resp. rate 18, height _0  (1.854 m), weight 172 lb (78.019 kg), SpO2 98 %. Physical Exam  Constitutional: He appears well-developed and well-nourished.  HENT:  Mouth/Throat: Oropharynx is clear and moist.  Eyes: Conjunctivae are normal.  Neck: No tracheal deviation present. No thyromegaly present.  Cardiovascular: Normal rate, regular rhythm and normal heart sounds.   No murmur heard. Respiratory: Effort normal and breath sounds normal.  GI: Soft. He exhibits no distension and no mass. There is no tenderness.  Musculoskeletal: He exhibits no edema.  Lymphadenopathy:    He has no cervical adenopathy.  Neurological: He is alert.  Skin: Skin is warm and dry.     Assessment/Plan Average risk screening colonoscopy.  REHMAN,NAJEEB U 01/06/2015, 9:38 AM

## 2015-01-06 NOTE — Progress Notes (Addendum)
COLONOSCOPY PROCEDURE REPORT  PATIENT:  Walter Taylor  MR#:  [id number] Birthdate:  [birth year], 69 y.o., male Endoscopist:  Dr. Rogene Houston, MD Referred By:  Dr. Alonza Bogus, MD  Procedure Date: 01/06/2015  Procedure:   Colonoscopy  Indications:  Patient is 69 year old Caucasian male who is undergoing average risk screening colonoscopy. His last exam was in March 2005.  Informed Consent:  The procedure and risks were reviewed with the patient and informed consent was obtained.  Medications:  Demerol 50 mg IV Versed 5 mg IV  Description of procedure:  After a digital rectal exam was performed, that colonoscope was advanced from the anus through the rectum and colon to the area of the cecum, ileocecal valve and appendiceal orifice. The cecum was deeply intubated. These structures were well-seen and photographed for the record. From the level of the cecum and ileocecal valve, the scope was slowly and cautiously withdrawn. The mucosal surfaces were carefully surveyed utilizing scope tip to flexion to facilitate fold flattening as needed. The scope was pulled down into the rectum where a thorough exam including retroflexion was performed.  Findings:   Prep excellent. Multiple diverticula noted at ascending colon. Few diverticula noted at sigmoid colon. Normal rectal mucosa. Hemorrhoids noted above and below the dentate line.   Therapeutic/Diagnostic Maneuvers Performed:   None  Complications:  None  Cecal Withdrawal Time:  11 minutes  Impression:  Examination performed to cecum. Multiple diverticula at ascending colon with few more at sigmoid colon. Internal and external hemorrhoids.  Recommendations:  Standard instructions given. High fiber diet. Next screening exam in 10 years.  Walter Taylor,Walter Taylor  01/06/2015 10:15 AM  CC: Dr. Alonza Bogus, MD & Dr. Rayne Du ref. provider found

## 2015-01-12 NOTE — Op Note (Signed)
COLONOSCOPY PROCEDURE REPORT  PATIENT:  Walter Taylor  MR#:  [id number] Birthdate:  03-Jan-1946, 69 y.o., male Endoscopist:  Dr. Rogene Houston, MD Referred By:  Dr. Alonza Bogus, MD  Procedure Date: 01/06/2015  Procedure:   Colonoscopy  Indications:  Patient is 69 year old Caucasian male who is undergoing average risk screening colonoscopy. His last exam was in March 2005.  Informed Consent:  The procedure and risks were reviewed with the patient and informed consent was obtained.  Medications:  Demerol 50 mg IV Versed 5 mg IV  Description of procedure:  After a digital rectal exam was performed, that colonoscope was advanced from the anus through the rectum and colon to the area of the cecum, ileocecal valve and appendiceal orifice. The cecum was deeply intubated. These structures were well-seen and photographed for the record. From the level of the cecum and ileocecal valve, the scope was slowly and cautiously withdrawn. The mucosal surfaces were carefully surveyed utilizing scope tip to flexion to facilitate fold flattening as needed. The scope was pulled down into the rectum where a thorough exam including retroflexion was performed.  Findings:   Prep excellent. Multiple diverticula noted at ascending colon. Few diverticula noted at sigmoid colon. Normal rectal mucosa. Hemorrhoids noted above and below the dentate line.   Therapeutic/Diagnostic Maneuvers Performed:   None  Complications:  None  Cecal Withdrawal Time:  11 minutes  Impression:  Examination performed to cecum. Multiple diverticula at ascending colon with few more at sigmoid colon. Internal and external hemorrhoids.  Recommendations:  Standard instructions given. High fiber diet. Next screening exam in 10 years.  Theador Jezewski U  01/06/2015 10:15 AM  CC: Dr. Alonza Bogus, MD & Dr. Rayne Du ref. provider found

## 2015-01-13 ENCOUNTER — Encounter (HOSPITAL_COMMUNITY): Payer: Self-pay | Admitting: Internal Medicine

## 2015-04-12 ENCOUNTER — Ambulatory Visit (INDEPENDENT_AMBULATORY_CARE_PROVIDER_SITE_OTHER): Payer: Medicare Other | Admitting: Internal Medicine

## 2015-04-12 ENCOUNTER — Encounter (INDEPENDENT_AMBULATORY_CARE_PROVIDER_SITE_OTHER): Payer: Self-pay | Admitting: *Deleted

## 2015-04-12 ENCOUNTER — Other Ambulatory Visit (INDEPENDENT_AMBULATORY_CARE_PROVIDER_SITE_OTHER): Payer: Self-pay | Admitting: *Deleted

## 2015-04-12 ENCOUNTER — Encounter (INDEPENDENT_AMBULATORY_CARE_PROVIDER_SITE_OTHER): Payer: Self-pay | Admitting: Internal Medicine

## 2015-04-12 VITALS — BP 100/62 | HR 72 | Temp 98.2°F | Ht 72.0 in | Wt 168.4 lb

## 2015-04-12 DIAGNOSIS — R1314 Dysphagia, pharyngoesophageal phase: Secondary | ICD-10-CM | POA: Diagnosis not present

## 2015-04-12 DIAGNOSIS — R1310 Dysphagia, unspecified: Secondary | ICD-10-CM

## 2015-04-12 NOTE — Patient Instructions (Signed)
EGD/ED. The risks and benefits such as perforation, bleeding, and infection were reviewed with the patient and is agreeable. 

## 2015-04-12 NOTE — Progress Notes (Addendum)
Subjective:    Patient ID: Walter Taylor, male    DOB: [birth year], 69 y.o.   MRN: [id number]  HPI Here today with c/o that a piece of steak lodged in his esophagus. Occurred Sunday afternoon.  Lodged mid esophagus.  The bolus has passed.  No nausea or vomiting.  His last EGD/ED in 2007 was normal for possible food impacton. Appetite has remained good. No weight loss. Usually has a BM daily. 01/12/2015 Colonoscopy: average risk.   Findings:  Prep excellent. Multiple diverticula noted at ascending colon. Few diverticula noted at sigmoid colon. Normal rectal mucosa. Hemorrhoids noted above and below the dentate line.       04/16/2006 Esophagogastroduodenoscopy with esophageal dilation.    INDICATIONS:  Walter Taylor is a 69 year old Caucasian male who an episode of food  impaction 4 weeks ago with spontaneous relief.  He had another episode 2  days ago while he was eating fried chicken.  He came to emergency room.  FINAL DIAGNOSIS:  Normal esophagogastroduodenoscopy.   Review of Systems Past Medical History  Diagnosis Date  . Palpitations     Hx of  . Hyperlipidemia   . Hypothyroidism   . Asthma   . Shortness of breath dyspnea   . GERD (gastroesophageal reflux disease)     occasionally  . Cancer     Prostate cancer    Past Surgical History  Procedure Laterality Date  . Finger surgery      Trigger finger release  . Vasectomy    . Elbow surgery      Tennis elbow  . Colonoscopy    . Left shoulder reconstruction    . Prostatectomy    . Colonoscopy N/A 01/06/2015    Procedure: COLONOSCOPY;  Surgeon: Rogene Houston, MD;  Location: [facility];  Service: Endoscopy;  Laterality: N/A;  49 - Dr. Laural Golden leaving around lunch for vactions    Allergies  Allergen Reactions  . Erythromycin   . Nsaids   . Statins     Current Outpatient Prescriptions on File Prior to Visit  Medication Sig Dispense Refill  . Cholecalciferol (VITAMIN D3) 5000 UNITS TABS Take 5,000 Units by  mouth daily.    Marland Kitchen ezetimibe (ZETIA) 10 MG tablet Take 10 mg by mouth.      . famotidine (PEPCID) 20 MG tablet Take 20 mg by mouth daily.    . Indacaterol Maleate 75 MCG CAPS Place 1 puff into inhaler and inhale daily.    Marland Kitchen levothyroxine (SYNTHROID, LEVOTHROID) 200 MCG tablet Take 200 mcg by mouth. 150 and then 175 two days a week    . Red Yeast Rice Extract (RED YEAST RICE PO) Take 1 capsule by mouth daily.     No current facility-administered medications on file prior to visit.        Objective:   Physical Exam  Blood pressure 100/62, pulse 72, temperature 98.2 F (36.8 C), height 6' (1.829 m), weight 168 lb 6.4 oz (76.386 kg).   Alert and oriented. Skin warm and dry. Oral mucosa is moist.   . Sclera anicteric, conjunctivae is pink. Thyroid not enlarged. No cervical lymphadenopathy. Lungs clear. Heart regular rate and rhythm.  Abdomen is soft. Bowel sounds are positive. No hepatomegaly. No abdominal masses felt. No tenderness.  No edema to lower extremities.          Assessment & Plan:  Solid food dysphagia. Stricture needs to be ruled out. Last EGD/ED in 2007 was normal.  EGD/ED. The risks and benefits  such as perforation, bleeding, and infection were reviewed with the patient and is agreeable.

## 2015-05-02 ENCOUNTER — Encounter (HOSPITAL_COMMUNITY): Payer: Self-pay | Admitting: *Deleted

## 2015-05-02 ENCOUNTER — Encounter (HOSPITAL_COMMUNITY)
Admission: RE | Disposition: A | Discharge status: Home / Self Care | Payer: Self-pay | Source: Ambulatory Visit | Attending: Internal Medicine

## 2015-05-02 ENCOUNTER — Ambulatory Visit (HOSPITAL_COMMUNITY)
Admission: RE | Admit: 2015-05-02 | Discharge: 2015-05-02 | Disposition: A | Discharge status: Home / Self Care | Payer: Medicare Other | Source: Ambulatory Visit | Attending: Internal Medicine | Admitting: Internal Medicine

## 2015-05-02 DIAGNOSIS — R002 Palpitations: Secondary | ICD-10-CM | POA: Insufficient documentation

## 2015-05-02 DIAGNOSIS — E039 Hypothyroidism, unspecified: Secondary | ICD-10-CM | POA: Insufficient documentation

## 2015-05-02 DIAGNOSIS — E785 Hyperlipidemia, unspecified: Secondary | ICD-10-CM | POA: Diagnosis not present

## 2015-05-02 DIAGNOSIS — K208 Other esophagitis: Secondary | ICD-10-CM | POA: Diagnosis not present

## 2015-05-02 DIAGNOSIS — Z8546 Personal history of malignant neoplasm of prostate: Secondary | ICD-10-CM | POA: Insufficient documentation

## 2015-05-02 DIAGNOSIS — Z888 Allergy status to other drugs, medicaments and biological substances status: Secondary | ICD-10-CM | POA: Diagnosis not present

## 2015-05-02 DIAGNOSIS — Z881 Allergy status to other antibiotic agents status: Secondary | ICD-10-CM | POA: Diagnosis not present

## 2015-05-02 DIAGNOSIS — J45909 Unspecified asthma, uncomplicated: Secondary | ICD-10-CM | POA: Insufficient documentation

## 2015-05-02 DIAGNOSIS — K449 Diaphragmatic hernia without obstruction or gangrene: Secondary | ICD-10-CM | POA: Insufficient documentation

## 2015-05-02 DIAGNOSIS — R1314 Dysphagia, pharyngoesophageal phase: Secondary | ICD-10-CM | POA: Diagnosis not present

## 2015-05-02 DIAGNOSIS — K229 Disease of esophagus, unspecified: Secondary | ICD-10-CM | POA: Diagnosis not present

## 2015-05-02 DIAGNOSIS — R1310 Dysphagia, unspecified: Secondary | ICD-10-CM | POA: Insufficient documentation

## 2015-05-02 DIAGNOSIS — K219 Gastro-esophageal reflux disease without esophagitis: Secondary | ICD-10-CM | POA: Insufficient documentation

## 2015-05-02 DIAGNOSIS — Z87891 Personal history of nicotine dependence: Secondary | ICD-10-CM | POA: Diagnosis not present

## 2015-05-02 HISTORY — PX: ESOPHAGOGASTRODUODENOSCOPY: SHX5428

## 2015-05-02 HISTORY — PX: ESOPHAGEAL DILATION: SHX303

## 2015-05-02 SURGERY — EGD (ESOPHAGOGASTRODUODENOSCOPY)
Anesthesia: Moderate Sedation

## 2015-05-02 MED ORDER — MIDAZOLAM HCL 5 MG/5ML IJ SOLN
INTRAMUSCULAR | Status: AC
  Filled 2015-05-02: qty 10

## 2015-05-02 MED ORDER — MIDAZOLAM HCL 5 MG/5ML IJ SOLN
INTRAMUSCULAR | Status: DC | PRN
  Administered 2015-05-02: 1 mg via INTRAVENOUS
  Administered 2015-05-02 (×3): 2 mg via INTRAVENOUS

## 2015-05-02 MED ORDER — STERILE WATER FOR IRRIGATION IR SOLN
Status: DC | PRN
  Administered 2015-05-02: 12:00:00

## 2015-05-02 MED ORDER — SODIUM CHLORIDE 0.9 % IV SOLN
INTRAVENOUS | Status: DC
  Administered 2015-05-02: 11:00:00 via INTRAVENOUS

## 2015-05-02 MED ORDER — MEPERIDINE HCL 50 MG/ML IJ SOLN
INTRAMUSCULAR | Status: DC | PRN
  Administered 2015-05-02 (×2): 25 mg via INTRAVENOUS

## 2015-05-02 MED ORDER — BUTAMBEN-TETRACAINE-BENZOCAINE 2-2-14 % EX AERO
INHALATION_SPRAY | CUTANEOUS | Status: DC | PRN
  Administered 2015-05-02: 2 via TOPICAL

## 2015-05-02 MED ORDER — MEPERIDINE HCL 50 MG/ML IJ SOLN
INTRAMUSCULAR | Status: AC
  Filled 2015-05-02: qty 1

## 2015-05-02 NOTE — H&P (Signed)
Walter Taylor is an 69 y.o. male.   Chief Complaint: Patient is here for EGD and ED. HPI: Patient is 69 year old Caucasian male who presents with few week history of intermittent solid food dysphagia. He's had 2 episodes relieved spontaneously. He denies frequent heartburn. He also denies nausea vomiting epigastric pain or melena. Last EGD ED was in 2007 and he responded to dilation although there was no structural abnormality.  Past Medical History  Diagnosis Date  . Palpitations     Hx of  . Hyperlipidemia   . Hypothyroidism   . Asthma   . Shortness of breath dyspnea   . GERD (gastroesophageal reflux disease)     occasionally  . Cancer     Prostate cancer    Past Surgical History  Procedure Laterality Date  . Finger surgery      Trigger finger release  . Vasectomy    . Elbow surgery      Tennis elbow  . Colonoscopy    . Left shoulder reconstruction    . Prostatectomy    . Colonoscopy N/A 01/06/2015    Procedure: COLONOSCOPY;  Surgeon: Rogene Houston, MD;  Location: [facility];  Service: Endoscopy;  Laterality: N/A;  22 - Dr. Laural Golden leaving around lunch for vactions  . Esophagogastroduodenoscopy (egd) with esophageal dilation  2007    History reviewed. No pertinent family history. Social History:  reports that he has quit smoking. His smoking use included Cigarettes. He has a 7.5 pack-year smoking history. He does not have any smokeless tobacco history on file. He reports that he drinks about 2.4 oz of alcohol per week. He reports that he does not use illicit drugs.  Allergies:  Allergies  Allergen Reactions  . Erythromycin   . Nsaids   . Statins     Medications Prior to Admission  Medication Sig Dispense Refill  . Cholecalciferol (VITAMIN D3) 5000 UNITS TABS Take 5,000 Units by mouth daily.    Marland Kitchen ezetimibe (ZETIA) 10 MG tablet Take 10 mg by mouth.      . Indacaterol Maleate (ARCAPTA NEOHALER) 75 MCG CAPS Place into inhaler and inhale.    . Red Yeast Rice  Extract (RED YEAST RICE PO) Take 1 capsule by mouth daily.    Marland Kitchen SYNTHROID 150 MCG tablet 150 mg daily and then 175 mg two days a week.  0  . zoledronic acid (RECLAST) 5 MG/100ML SOLN injection Inject 5 mg into the vein once.   0    No results found for this or any previous visit (from the past 48 hour(s)). No results found.  ROS  Blood pressure 123/71, pulse 50, temperature 97.9 F (36.6 C), temperature source Oral, resp. rate 14, height 6' (1.829 m), weight 168 lb (76.204 kg), SpO2 99 %. Physical Exam  Constitutional:  Well-developed thin Caucasian male in NAD.  HENT:  Mouth/Throat: Oropharynx is clear and moist.  Eyes: Conjunctivae are normal. No scleral icterus.  Neck: No thyromegaly present.  Cardiovascular: Normal rate, regular rhythm and normal heart sounds.   No murmur heard. Respiratory: Effort normal and breath sounds normal.  GI: Soft. He exhibits no distension and no mass. There is no tenderness.  Musculoskeletal: He exhibits no edema.  Lymphadenopathy:    He has no cervical adenopathy.  Neurological: He is alert.  Skin: Skin is warm and dry.     Assessment/Plan Solid food dysphagia. EGD with ED.  Rebie Peale U 05/02/2015, 12:27 PM

## 2015-05-02 NOTE — Op Note (Signed)
EGD PROCEDURE REPORT  PATIENT:  Walter Taylor  MR#:  [id number] Birthdate:  Jul 28, 1946, 69 y.o., male Endoscopist:  Dr. Rogene Houston, MD Referred By:  Dr. Alonza Bogus, MD  Procedure Date: 05/02/2015  Procedure:   EGD with ED  Indications:  Patient is 69 year old Caucasian male who presents with few week history of solid food dysphagia. He has had few episodes of for impaction spontaneous relief. He denies frequent heartburn. Last EGD/ED was in 2007. Even though no structural abnormality was noted to esophagus he responded to dilation.            Informed Consent:  The risks, benefits, alternatives & imponderables which include, but are not limited to, bleeding, infection, perforation, drug reaction and potential missed lesion have been reviewed.  The potential for biopsy, lesion removal, esophageal dilation, etc. have also been discussed.  Questions have been answered.  All parties agreeable.  Please see history & physical in medical record for more information.  Medications:  Demerol 50 mg IV Versed 7 mg IV Cetacaine spray topically for oropharyngeal anesthesia  Description of procedure:  The endoscope was introduced through the mouth and advanced to the second portion of the duodenum without difficulty or limitations. The mucosal surfaces were surveyed very carefully during advancement of the scope and upon withdrawal.  Findings:  Esophagus:  Mucosa of the esophagus was normal. Wrinkling noted to mucosa at distal esophagus without formation of stricture. No ring noted at GE junction. GEJ:  44 cm Hiatus:  46 cm Stomach:  Stomach was empty and distended very well with insufflation. Folds in the proximal stomach were normal. Examination of mucosa at gastric body, antrum, pyloric channel, angularis fundus and cardia was normal. Duodenum:  Bulbar and post bulbar mucosa.  Therapeutic/Diagnostic Maneuvers Performed:   Esophagus was dilated by passing 56 [place] Maloney dilator to  full insertion. As the dilator was withdrawn endoscope was passed again and esophageal mucosa reexamined. No mucosal disruption noted. Multiple biopsies were taken from mucosa of distal esophagus looking for eosinophilic esophagitis.  Complications: None  Impression: Focal mucosal wrinkling noted to distal esophagus without stricture formation. Small sliding hiatal hernia without Schatzki's ring. Esophagus dilated by passing 56 [place] Maloney dilator but no mucosal disruption induced. Biopsies taken from mucosa of distal esophagus looking for eosinophilic esophagitis.  Recommendations:  Standard instructions given. I will be contacting patient with biopsy results.  Teshia Mahone U  05/02/2015  1:02 PM  CC: Dr. Alonza Bogus, MD & Dr. Rayne Du ref. provider found

## 2015-05-02 NOTE — Discharge Instructions (Signed)
Resume usual medications and diet. No driving for 24 hours. Physician will call with biopsy results.  Esophagogastroduodenoscopy Esophagogastroduodenoscopy (EGD) is a procedure to examine the lining of the esophagus, stomach, and first part of the small intestine (duodenum). A long, flexible, lighted tube with a camera attached (endoscope) is inserted down the throat to view these organs. This procedure is done to detect problems or abnormalities, such as inflammation, bleeding, ulcers, or growths, in order to treat them. The procedure lasts about 5-20 minutes. It is usually an outpatient procedure, but it may need to be performed in emergency cases in the hospital. LET YOUR CAREGIVER KNOW ABOUT:   Allergies to food or medicine.  All medicines you are taking, including vitamins, herbs, eyedrops, and over-the-counter medicines and creams.  Use of steroids (by mouth or creams).  Previous problems you or members of your family have had with the use of anesthetics.  Any blood disorders you have.  Previous surgeries you have had.  Other health problems you have.  Possibility of pregnancy, if this applies. RISKS AND COMPLICATIONS  Generally, EGD is a safe procedure. However, as with any procedure, complications can occur. Possible complications include:  Infection.  Bleeding.  Tearing (perforation) of the esophagus, stomach, or duodenum.  Difficulty breathing or not being able to breath.  Excessive sweating.  Spasms of the larynx.  Slowed heartbeat.  Low blood pressure. BEFORE THE PROCEDURE  Do not eat or drink anything for 6-8 hours before the procedure or as directed by your caregiver.  Ask your caregiver about changing or stopping your regular medicines.  If you wear dentures, be prepared to remove them before the procedure.  Arrange for someone to drive you home after the procedure. PROCEDURE   A vein will be accessed to give medicines and fluids. A medicine to relax  you (sedative) and a pain reliever will be given through that access into the vein.  A numbing medicine (local anesthetic) may be sprayed on your throat for comfort and to stop you from gagging or coughing.  A mouth guard may be placed in your mouth to protect your teeth and to keep you from biting on the endoscope.  You will be asked to lie on your left side.  The endoscope is inserted down your throat and into the esophagus, stomach, and duodenum.  Air is put through the endoscope to allow your caregiver to view the lining of your esophagus clearly.  The esophagus, stomach, and duodenum is then examined. During the exam, your caregiver may:  Remove tissue to be examined under a microscope (biopsy) for inflammation, infection, or other medical problems.  Remove growths.  Remove objects (foreign bodies) that are stuck.  Treat any bleeding with medicines or other devices that stop tissues from bleeding (hot cautery, clipping devices).  Widen (dilate) or stretch narrowed areas of the esophagus and stomach.  The endoscope will then be withdrawn. AFTER THE PROCEDURE  You will be taken to a recovery area to be monitored. You will be able to go home once you are stable and alert.  Do not eat or drink anything until the local anesthetic and numbing medicines have worn off. You may choke.  It is normal to feel bloated, have pain with swallowing, or have a sore throat for a short time. This will wear off.  Your caregiver should be able to discuss his or her findings with you. It will take longer to discuss the test results if any biopsies were taken. Document Released:  02/08/2005 Document Revised: 02/22/2014 Document Reviewed: 09/10/2012 ExitCare Patient Information 2015 [place]. This information is not intended to replace advice given to you by your health care provider. Make sure you discuss any questions you have with your health care provider.

## 2015-05-03 ENCOUNTER — Encounter (HOSPITAL_COMMUNITY): Payer: Self-pay | Admitting: Internal Medicine

## 2015-05-06 ENCOUNTER — Other Ambulatory Visit (INDEPENDENT_AMBULATORY_CARE_PROVIDER_SITE_OTHER): Payer: Self-pay | Admitting: Internal Medicine

## 2015-05-06 DIAGNOSIS — R1314 Dysphagia, pharyngoesophageal phase: Secondary | ICD-10-CM

## 2015-05-09 ENCOUNTER — Encounter (INDEPENDENT_AMBULATORY_CARE_PROVIDER_SITE_OTHER): Payer: Self-pay | Admitting: *Deleted

## 2015-05-11 ENCOUNTER — Ambulatory Visit (HOSPITAL_COMMUNITY)
Admission: RE | Admit: 2015-05-11 | Discharge: 2015-05-11 | Disposition: A | Discharge status: Home / Self Care | Payer: Medicare Other | Source: Ambulatory Visit | Attending: Internal Medicine | Admitting: Internal Medicine

## 2015-05-11 DIAGNOSIS — R1314 Dysphagia, pharyngoesophageal phase: Secondary | ICD-10-CM | POA: Diagnosis present

## 2015-05-25 ENCOUNTER — Encounter (INDEPENDENT_AMBULATORY_CARE_PROVIDER_SITE_OTHER): Payer: Self-pay

## 2015-07-24 LAB — HM COLONOSCOPY

## 2016-09-19 ENCOUNTER — Other Ambulatory Visit: Payer: Self-pay | Admitting: Dermatology

## 2018-03-17 ENCOUNTER — Encounter (HOSPITAL_COMMUNITY): Payer: Self-pay | Admitting: Emergency Medicine

## 2018-03-17 ENCOUNTER — Other Ambulatory Visit: Payer: Self-pay

## 2018-03-17 ENCOUNTER — Emergency Department (HOSPITAL_COMMUNITY)
Admission: EM | Admit: 2018-03-17 | Discharge: 2018-03-17 | Disposition: A | Discharge status: Home / Self Care | Payer: Medicare HMO | Attending: Emergency Medicine | Admitting: Emergency Medicine

## 2018-03-17 ENCOUNTER — Emergency Department (HOSPITAL_COMMUNITY): Payer: Medicare HMO

## 2018-03-17 DIAGNOSIS — S86811A Strain of other muscle(s) and tendon(s) at lower leg level, right leg, initial encounter: Secondary | ICD-10-CM | POA: Diagnosis not present

## 2018-03-17 DIAGNOSIS — Z23 Encounter for immunization: Secondary | ICD-10-CM | POA: Diagnosis not present

## 2018-03-17 DIAGNOSIS — Y92410 Unspecified street and highway as the place of occurrence of the external cause: Secondary | ICD-10-CM | POA: Diagnosis not present

## 2018-03-17 DIAGNOSIS — Z79899 Other long term (current) drug therapy: Secondary | ICD-10-CM | POA: Diagnosis not present

## 2018-03-17 DIAGNOSIS — S86111A Strain of other muscle(s) and tendon(s) of posterior muscle group at lower leg level, right leg, initial encounter: Secondary | ICD-10-CM

## 2018-03-17 DIAGNOSIS — Y998 Other external cause status: Secondary | ICD-10-CM | POA: Insufficient documentation

## 2018-03-17 DIAGNOSIS — Z8546 Personal history of malignant neoplasm of prostate: Secondary | ICD-10-CM | POA: Diagnosis not present

## 2018-03-17 DIAGNOSIS — Y9355 Activity, bike riding: Secondary | ICD-10-CM | POA: Insufficient documentation

## 2018-03-17 DIAGNOSIS — E039 Hypothyroidism, unspecified: Secondary | ICD-10-CM | POA: Diagnosis not present

## 2018-03-17 DIAGNOSIS — W540XXA Bitten by dog, initial encounter: Secondary | ICD-10-CM | POA: Insufficient documentation

## 2018-03-17 DIAGNOSIS — Z87891 Personal history of nicotine dependence: Secondary | ICD-10-CM | POA: Diagnosis not present

## 2018-03-17 DIAGNOSIS — J45909 Unspecified asthma, uncomplicated: Secondary | ICD-10-CM | POA: Diagnosis not present

## 2018-03-17 DIAGNOSIS — S81811A Laceration without foreign body, right lower leg, initial encounter: Secondary | ICD-10-CM | POA: Diagnosis present

## 2018-03-17 MED ORDER — POVIDONE-IODINE 10 % EX SOLN
CUTANEOUS | Status: AC
  Filled 2018-03-17: qty 15

## 2018-03-17 MED ORDER — ACETAMINOPHEN-CODEINE #3 300-30 MG PO TABS: 1.0000 | ORAL_TABLET | Freq: Four times a day (QID) | ORAL | 0 refills | Status: DC | PRN

## 2018-03-17 MED ORDER — AMOXICILLIN-POT CLAVULANATE 875-125 MG PO TABS: 1.0000 | ORAL_TABLET | Freq: Two times a day (BID) | ORAL | 0 refills | Status: DC

## 2018-03-17 MED ORDER — LIDOCAINE-EPINEPHRINE (PF) 1 %-1:200000 IJ SOLN
10.0000 mL | Freq: Once | INTRAMUSCULAR | Status: AC
  Administered 2018-03-17: 10 mL
  Filled 2018-03-17: qty 30

## 2018-03-17 MED ORDER — AMOXICILLIN-POT CLAVULANATE 875-125 MG PO TABS
1.0000 | ORAL_TABLET | Freq: Once | ORAL | Status: AC
  Administered 2018-03-17: 1 via ORAL
  Filled 2018-03-17: qty 1

## 2018-03-17 MED ORDER — TETANUS-DIPHTH-ACELL PERTUSSIS 5-2.5-18.5 LF-MCG/0.5 IM SUSP
0.5000 mL | Freq: Once | INTRAMUSCULAR | Status: AC
  Administered 2018-03-17: 0.5 mL via INTRAMUSCULAR
  Filled 2018-03-17: qty 0.5

## 2018-03-17 NOTE — ED Provider Notes (Signed)
[facility] EMERGENCY DEPARTMENT Provider Note   CSN: [id number] Arrival date & time: 03/17/18  0857     History   Chief Complaint Chief Complaint  Patient presents with  . Animal Bite    HPI Walter Taylor is a 72 y.o. male who was riding his bike past his neighbors home when their dog broke through his invisible fence on bit him on the back of his right leg causing deep laceration.  The dog is current with his rabies vaccine.  Pt denies weakness distal to the injury site but is concern about injury to his achilles tendon.  He has had no treatment prior to arrival.  He is not current with his tetanus vaccine.  The history is provided by the patient.    Past Medical History:  Diagnosis Date  . Asthma   . Cancer [facility])    Prostate cancer  . GERD (gastroesophageal reflux disease)    occasionally  . Hyperlipidemia   . Hypothyroidism   . Palpitations    Hx of  . Shortness of breath dyspnea     Patient Active Problem List   Diagnosis Date Noted  . HYPERLIPIDEMIA 04/05/2009  . HYPERTHYROIDISM, HX OF 04/05/2009  . PALPITATIONS, HX OF 04/05/2009    Past Surgical History:  Procedure Laterality Date  . COLONOSCOPY    . COLONOSCOPY N/A 01/06/2015   Procedure: COLONOSCOPY;  Surgeon: Rogene Houston, MD;  [facility];  Service: Endoscopy;  Laterality: N/A;  66 - Dr. Laural Golden leaving around lunch for vactions  . ELBOW SURGERY     Tennis elbow  . ESOPHAGEAL DILATION N/A 05/02/2015   Procedure: ESOPHAGEAL DILATION;  Surgeon: Rogene Houston, MD;  Location[facility];  Service: Endoscopy;  Laterality: N/A;  . ESOPHAGOGASTRODUODENOSCOPY N/A 05/02/2015   Procedure: ESOPHAGOGASTRODUODENOSCOPY (EGD);  Surgeon: Rogene Houston, MD;  Location[facility];  Service: Endoscopy;  Laterality: N/A;  1150  . ESOPHAGOGASTRODUODENOSCOPY (EGD) WITH ESOPHAGEAL DILATION  2007  . FINGER SURGERY     Trigger finger release  . Left shoulder reconstruction    . PROSTATECTOMY      . VASECTOMY          Home Medications    Prior to Admission medications   Medication Sig Start Date End Date Taking? Authorizing Provider  acetaminophen (TYLENOL) 500 MG tablet Take 1,000 mg by mouth every 6 (six) hours as needed.   Yes [provider]  albuterol (PROVENTIL HFA;VENTOLIN HFA) 108 (90 Base) MCG/ACT inhaler Inhale 1-2 puffs into the lungs every 6 (six) hours as needed for wheezing or shortness of breath.   Yes [provider]  Cholecalciferol (VITAMIN D3) 5000 UNITS TABS Take 5,000 Units by mouth daily.   Yes [provider]  denosumab (PROLIA) 60 MG/ML SOSY injection Inject 60 mg into the skin every 6 (six) months.   Yes [provider]  ezetimibe (ZETIA) 10 MG tablet Take 10 mg by mouth.     Yes [provider]  salmeterol (SEREVENT) 50 MCG/DOSE diskus inhaler Inhale 1 puff into the lungs 2 (two) times daily.   Yes [provider]  SYNTHROID 150 MCG tablet 150 mg daily and then 175 mg one day a week. 01/18/15  Yes [provider]  acetaminophen-codeine (TYLENOL #3) 300-30 MG tablet Take 1-2 tablets by mouth every 6 (six) hours as needed for moderate pain. 03/17/18   Evalee Jefferson, PA-C  amoxicillin-clavulanate (AUGMENTIN) 875-125 MG tablet Take 1 tablet by mouth every  12 (twelve) hours. 03/17/18   Evalee Jefferson, PA-C    Family History History reviewed. No pertinent family history.  Social History Social History   Tobacco Use  . Smoking status: Former Smoker    Packs/day: 0.50    Years: 15.00    Pack years: 7.50    Types: Cigarettes  Substance Use Topics  . Alcohol use: Yes    Alcohol/week: 2.4 oz    Types: 4 Cans of beer per week    Comment: moderate per pt  . Drug use: No     Allergies   Erythromycin; Nsaids; and Statins   Review of Systems Review of Systems  Constitutional: Negative for chills and fever.  Respiratory: Negative.   Cardiovascular: Negative.   Gastrointestinal: Negative.    Musculoskeletal: Negative for arthralgias.  Skin: Positive for wound.  Neurological: Negative for numbness.     Physical Exam Updated Vital Signs BP 123/72 (BP Location: Left Arm)   Pulse (!) 58   Temp 98 F (36.7 C) (Oral)   Resp 16   Ht 6' (1.829 m)   Wt 78 kg (172 lb)   SpO2 98%   BMI 23.33 kg/m   Physical Exam  Constitutional: He appears well-developed and well-nourished.  HENT:  Head: Atraumatic.  Neck: Normal range of motion.  Cardiovascular:  Pulses equal bilaterally  Musculoskeletal: He exhibits no tenderness.  Neurological: He is alert. He has normal strength. He displays normal reflexes. No sensory deficit.  Skin: Skin is warm and dry. Laceration noted.  Laceration/ deep tear to the right posterior leg near the center of the inferior gastroc. Hemostatic.  The achilles tendon appears mostly intact.  There is a strand of flat tendon that has been disrupted, possibly from the lateral edge of the achilles. Abrasion noted at the lateral leg at the same level of the laceration. No punctures appreciated.  Psychiatric: He has a normal mood and affect.     ED Treatments / Results  Labs (all labs ordered are listed, but only abnormal results are displayed) Labs Reviewed - No data to display  EKG None  Radiology Dg Tibia/fibula Right  Result Date: 03/17/2018 CLINICAL DATA:  neighbors dog bit him on the back of the right ankle this morning. gash low rt leg posterior EXAM: RIGHT TIBIA AND FIBULA - 2 VIEW COMPARISON:  None. FINDINGS: There is a soft tissue injury to the posterior aspect of the lower leg, with soft tissue air, but no radiopaque foreign body. No fracture.  No bone lesion. Knee and ankle joints are normally aligned. IMPRESSION: 1. No fracture or dislocation. 2. Soft tissue injury to the posterior lower leg. No radiopaque foreign body. Electronically Signed   By: Lajean Manes M.D.   On: 03/17/2018 09:55    Procedures Procedures (including critical care  time)  LACERATION REPAIR Performed by: Evalee Jefferson Authorized by: Evalee Jefferson Consent: Verbal consent obtained. Risks and benefits: risks, benefits and alternatives were discussed Consent given by: patient Patient identity confirmed: provided demographic data Prepped and Draped in normal sterile fashion Wound explored  Laceration Location: right posterior calf  Laceration Length: 5 cm  No Foreign Bodies seen or palpated  Anesthesia: local infiltration  Local anesthetic: lidocaine 1% with epinephrine  Anesthetic total: 6 ml  Irrigation method: syringe Amount of cleaning: standard  Skin closure: ethilon 4-0   Number of sutures: 5  Technique: simple interupted, loosely approximated  Patient tolerance: Patient tolerated the procedure well with no immediate complications.  SPLINT APPLICATION Date/Time: 34:19  PM Authorized by: Evalee Jefferson Consent: Verbal consent obtained. Risks and benefits: risks, benefits and alternatives were discussed Consent given by: patient Splint applied by:RN Location details: right leg Splint type: posterior Supplies used: webril, fiberglass, ace wraps Post-procedure: The splinted body part was neurovascularly unchanged following the procedure. Patient tolerance: Patient tolerated the procedure well with no immediate complications.    Medications Ordered in ED Medications  Tdap (BOOSTRIX) injection 0.5 mL (0.5 mLs Intramuscular Given 03/17/18 0955)  lidocaine-EPINEPHrine (XYLOCAINE-EPINEPHrine) 1 %-1:200000 (PF) injection 10 mL (10 mLs Other Given by Other 03/17/18 1045)  amoxicillin-clavulanate (AUGMENTIN) 875-125 MG per tablet 1 tablet (1 tablet Oral Given 03/17/18 1157)     Initial Impression / Assessment and Plan / ED Course  I have reviewed the triage vital signs and the nursing notes.  Pertinent labs & imaging results that were available during my care of the patient were reviewed by me and considered in my medical decision making  (see chart for details).     Discussed with Dr. Veverly Fells with [facility] - advised wound care, loose closure, posterior splint and crutches.  Will see pt in office tomorrow at 8:30 with plans for a repeat wash out of the wound on Wednesday.  He was started on Augmentin, first dose given here, tetanus updated.  The dog is current on rabies vaccine, pt presents with a copy of the rabies cert, although observed that the dog discription is a different color (the neighbor has 2 dogs - he will confirm that the culprit dog is also current).  [employer] presented and took report.   Final Clinical Impressions(s) / ED Diagnoses   Final diagnoses:  Dog bite, initial encounter  Gastrocnemius tendon tear, right, initial encounter    ED Discharge Orders        Ordered    acetaminophen-codeine (TYLENOL #3) 300-30 MG tablet  Every 6 hours PRN     03/17/18 1145    amoxicillin-clavulanate (AUGMENTIN) 875-125 MG tablet  Every 12 hours     03/17/18 1145       Evalee Jefferson, PA-C 03/18/18 Gholson, Hendricks, DO 03/22/18 [phone]

## 2018-03-17 NOTE — Discharge Instructions (Addendum)
Keep your wound covered until seen by Dr. Veverly Fells tomorrow.  Take one more dose of your augmentin this evening.  You may take the tylenol #3 prescribed for pain relief.  This will make you drowsy - do not drive within 4 hours of taking this medication.

## 2018-03-17 NOTE — ED Triage Notes (Signed)
Pt states his neighbors dog bit him on the back of the right ankle this morning.  Rabies papers with pt.

## 2018-03-17 NOTE — ED Notes (Signed)
Telfa dressing x2 with kerlix dressing applied to right calf, patient tolerated well.

## 2020-02-03 ENCOUNTER — Encounter: Payer: Self-pay | Admitting: Family Medicine

## 2020-02-03 ENCOUNTER — Ambulatory Visit: Payer: Medicare HMO | Admitting: Family Medicine

## 2020-02-03 ENCOUNTER — Other Ambulatory Visit: Payer: Self-pay

## 2020-02-03 VITALS — BP 142/80 | HR 66 | Temp 97.6°F | Ht 72.0 in | Wt 169.6 lb

## 2020-02-03 DIAGNOSIS — M8580 Other specified disorders of bone density and structure, unspecified site: Secondary | ICD-10-CM | POA: Diagnosis not present

## 2020-02-03 DIAGNOSIS — E039 Hypothyroidism, unspecified: Secondary | ICD-10-CM | POA: Diagnosis not present

## 2020-02-03 DIAGNOSIS — E785 Hyperlipidemia, unspecified: Secondary | ICD-10-CM

## 2020-02-03 DIAGNOSIS — J302 Other seasonal allergic rhinitis: Secondary | ICD-10-CM

## 2020-02-03 DIAGNOSIS — J449 Chronic obstructive pulmonary disease, unspecified: Secondary | ICD-10-CM | POA: Diagnosis not present

## 2020-02-03 NOTE — Progress Notes (Signed)
New Patient Office Visit  Subjective:  Patient ID: Walter Taylor, male    DOB: Dec 05, 1945  Age: 74 y.o. MRN: [id number]  CC:  Chief Complaint  Patient presents with  . Establish Care    here for a well check and to get refills on medication    HPI Walter Taylor presents for hypothyroid-synthroid COPD Hyperlipidemia   Past Medical History:  Diagnosis Date  . Allergy   . Asthma   . Cancer [facility])    Prostate cancer  . Emphysema lung ([place])   . GERD (gastroesophageal reflux disease)    occasionally  . Heart murmur   . Hyperlipidemia   . Hypothyroidism   . Osteoporosis   . Palpitations    Hx of  . Shortness of breath dyspnea     Past Surgical History:  Procedure Laterality Date  . COLONOSCOPY    . COLONOSCOPY N/A 01/06/2015   Procedure: COLONOSCOPY;  Surgeon: Rogene Houston, MD;  [facility];  Service: Endoscopy;  Laterality: N/A;  40 - Dr. Laural Golden leaving around lunch for vactions  . ELBOW SURGERY     Tennis elbow  . ESOPHAGEAL DILATION N/A 05/02/2015   Procedure: ESOPHAGEAL DILATION;  Surgeon: Rogene Houston, MD;  [facility];  Service: Endoscopy;  Laterality: N/A;  . ESOPHAGOGASTRODUODENOSCOPY N/A 05/02/2015   Procedure: ESOPHAGOGASTRODUODENOSCOPY (EGD);  Surgeon: Rogene Houston, MD;  [facility];  Service: Endoscopy;  Laterality: N/A;  1150  . ESOPHAGOGASTRODUODENOSCOPY (EGD) WITH ESOPHAGEAL DILATION  2007  . FINGER SURGERY     Trigger finger release  . Left shoulder reconstruction    . PROSTATECTOMY    . VASECTOMY      Family History  Problem Relation Age of Onset  . Heart disease Father     Social History   Socioeconomic History  . Marital status: Married    Spouse name: Not on file  . Number of children: Not on file  . Years of education: Not on file  . Highest education level: Not on file  Occupational History  . 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 Smoking status: Former  Smoker    Packs/day: 0.50    Years: 15.00    Pack years: 7.50    Types: Cigarettes  Substance and Sexual Activity  . Alcohol use: Yes    Alcohol/week: 4.0 standard drinks    Types: 4 Cans of beer per week    Comment: moderate per pt  . Drug use: No  . Sexual activity: Not on file  Other Topics Concern  . Not on file  Social History Narrative   Married with 2 children   Exercises regularly - bikes and swims   Social Determinants of Health   Financial Resource Strain:   . Difficulty of Paying Living Expenses:   Food Insecurity:   . Worried About Charity fundraiser in the Last Year:   . Arboriculturist in the Last Year:   Transportation Needs:   . Film/video editor (Medical):   Marland Kitchen Lack of Transportation (Non-Medical):   Physical Activity:   . Days of Exercise per Week:   . Minutes of Exercise per Session:   Stress:   . Feeling of Stress :   Social Connections:   . Frequency of Communication with Friends and Family:   . Frequency of Social Gatherings with Friends and Family:   . Attends Religious Services:   . Active  Member of Clubs or Organizations:   . Attends Archivist Meetings:   Marland Kitchen Marital Status:   Intimate Partner Violence:   . Fear of Current or Ex-Partner:   . Emotionally Abused:   Marland Kitchen Physically Abused:   . Sexually Abused:     ROS Review of Systems  Constitutional: Negative.   HENT: Positive for congestion and hearing loss.        Left ear-aide  Eyes: Positive for itching.       Glasses  Respiratory:       Chest wall pain COPD-  Gastrointestinal: Negative.        Pepcid for GERD H/o gastric ulcer  Genitourinary: Negative.        Prostatectomy -[facility]rology-[facility]   Musculoskeletal: Positive for arthralgias.       Tumeric Vit D  Allergic/Immunologic: Positive for environmental allergies.  Neurological: Negative.     Objective:   Today's Vitals: BP (!) 142/80 (BP Location: Left Arm, Patient Position: Sitting, Cuff Size: Normal)    Pulse 66   Temp 97.6 F (36.4 C) (Temporal)   Ht 6' (1.829 m)   Wt 169 lb 9.6 oz (76.9 kg)   SpO2 98%   BMI 23.00 kg/m   Physical Exam Constitutional:      Appearance: Normal appearance.  HENT:     Head: Normocephalic and atraumatic.  Eyes:     Conjunctiva/sclera: Conjunctivae normal.  Cardiovascular:     Rate and Rhythm: Normal rate and regular rhythm.     Pulses: Normal pulses.     Heart sounds: Normal heart sounds.  Pulmonary:     Effort: Pulmonary effort is normal.     Breath sounds: Normal breath sounds.  Musculoskeletal:        General: Normal range of motion.     Cervical back: Normal range of motion and neck supple.  Neurological:     Mental Status: He is alert and oriented to person, place, and time.  Psychiatric:        Mood and Affect: Mood normal.        Behavior: Behavior normal.     Assessment & Plan:  1. Hyperlipidemia, unspecified hyperlipidemia type Simvastatin/Zetia-need copy of labwork for continued follow up  2. Chronic obstructive pulmonary disease, unspecified COPD type ([place]) serevent/albuterol  3. Osteopenia, unspecified location No longer taking Prolia  4. Hypothyroidism, unspecified type Synthroid TSH  5. Seasonal allergic rhinitis, unspecified trigger Flonase/patanol-suggest xyzal Outpatient Encounter Medications as of 02/03/2020  Medication Sig  . albuterol (PROVENTIL HFA;VENTOLIN HFA) 108 (90 Base) MCG/ACT inhaler Inhale 1-2 puffs into the lungs every 6 (six) hours as needed for wheezing or shortness of breath.  . Cholecalciferol (VITAMIN D3) 5000 UNITS TABS Take 5,000 Units by mouth daily.  Marland Kitchen ezetimibe (ZETIA) 10 MG tablet Take 10 mg by mouth.    . salmeterol (SEREVENT) 50 MCG/DOSE diskus inhaler Inhale 1 puff into the lungs 2 (two) times daily.  Marland Kitchen SYNTHROID 150 MCG tablet 150 mg daily and then 175 mg one day a week.  . [DISCONTINUED] acetaminophen (TYLENOL) 500 MG tablet Take 1,000 mg by mouth every 6 (six) hours as needed.  .  [DISCONTINUED] acetaminophen-codeine (TYLENOL #3) 300-30 MG tablet Take 1-2 tablets by mouth every 6 (six) hours as needed for moderate pain.  . [DISCONTINUED] amoxicillin-clavulanate (AUGMENTIN) 875-125 MG tablet Take 1 tablet by mouth every 12 (twelve) hours.  . [DISCONTINUED] denosumab (PROLIA) 60 MG/ML SOSY injection Inject 60 mg into the skin every 6 (six) months.  No facility-administered encounter medications on file as of 02/03/2020.  Follow-up: fasting labwork Walter Whitmore Hannah Beat, MD

## 2020-02-03 NOTE — Patient Instructions (Addendum)
  Fasting labwork xyzal for allergies   If you have lab work done today you will be contacted with your lab results within the next 2 weeks.  If you have not heard from [place] then please contact us. The fastest way to get your results is to register for My Chart.   IF you received an x-ray today, you will receive an invoice from [facility]. Please contact [facility] at [phone] with questions or concerns regarding your invoice.   IF you received labwork today, you will receive an invoice from [facility]. Please contact [facility] at [phone] with questions or concerns regarding your invoice.   Our billing staff will not be able to assist you with questions regarding bills from these companies.  You will be contacted with the lab results as soon as they are available. The fastest way to get your results is to activate your My Chart account. Instructions are located on the last page of this paperwork. If you have not heard from [place] regarding the results in 2 weeks, please contact this office.

## 2020-02-16 ENCOUNTER — Ambulatory Visit: Payer: Medicare HMO | Admitting: Family Medicine

## 2020-02-22 ENCOUNTER — Other Ambulatory Visit: Payer: Self-pay

## 2020-02-22 DIAGNOSIS — E785 Hyperlipidemia, unspecified: Secondary | ICD-10-CM

## 2020-02-22 MED ORDER — EZETIMIBE 10 MG PO TABS: 10.0000 mg | ORAL_TABLET | Freq: Every day | ORAL | 5 refills | Status: DC

## 2020-03-07 ENCOUNTER — Other Ambulatory Visit: Payer: Self-pay

## 2020-03-07 DIAGNOSIS — J449 Chronic obstructive pulmonary disease, unspecified: Secondary | ICD-10-CM

## 2020-03-07 MED ORDER — ALBUTEROL SULFATE HFA 108 (90 BASE) MCG/ACT IN AERS: 1.0000 | INHALATION_SPRAY | Freq: Four times a day (QID) | RESPIRATORY_TRACT | 1 refills | Status: DC | PRN

## 2020-03-16 ENCOUNTER — Other Ambulatory Visit (HOSPITAL_COMMUNITY): Payer: Self-pay | Admitting: Internal Medicine

## 2020-03-16 DIAGNOSIS — R079 Chest pain, unspecified: Secondary | ICD-10-CM

## 2020-03-22 ENCOUNTER — Other Ambulatory Visit: Payer: Self-pay

## 2020-03-22 ENCOUNTER — Ambulatory Visit (HOSPITAL_COMMUNITY)
Admission: RE | Admit: 2020-03-22 | Discharge: 2020-03-22 | Disposition: A | Discharge status: Home / Self Care | Payer: Medicare HMO | Source: Ambulatory Visit | Attending: Internal Medicine | Admitting: Internal Medicine

## 2020-03-22 DIAGNOSIS — R079 Chest pain, unspecified: Secondary | ICD-10-CM

## 2020-03-23 ENCOUNTER — Ambulatory Visit: Payer: Medicare HMO | Admitting: Family Medicine

## 2020-03-28 NOTE — Progress Notes (Signed)
CARDIOLOGY CONSULT NOTE       Patient ID: Walter Taylor MRN: [id number] DOB/AGE: [birth year] 74 y.o.  Admit date: (Not on file) Referring Physician: Lelon Frohlich FNP  Primary Physician: Maryruth Hancock, MD Primary Cardiologist: New Reason for Consultation: Chest Pain / Murmur   Active Problems:   * No active hospital problems. *   HPI:  74 y.o. with hisotry of hypothyroidism, COPD and HLD. Seen by Dr Verl Blalock in 2010/2012 for palpitations and PAC;s cleared back then to have prostatectomy for cancer. 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 Allergy   . Asthma   . Cancer [facility])    Prostate cancer  . Emphysema lung ([place])   . GERD (gastroesophageal reflux disease)    occasionally  . Heart murmur   . Hyperlipidemia   . Hypothyroidism   . Osteoporosis   . Palpitations    Hx of  . Shortness of breath dyspnea     Family History  Problem Relation Age of Onset  . Heart disease Father     Social History   Socioeconomic History  . Marital status: Married    Spouse name: Not on file  . Number of children: Not on file  . Years of  education: Not on file  . Highest education level: Not on file  Occupational History  . 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 Smoking status: Former Smoker    Packs/day: 0.50    Years: 15.00    Pack years: 7.50    Types: Cigarettes  . Smokeless tobacco: Never Used  Substance and Sexual Activity  . Alcohol use: Yes    Alcohol/week: 4.0 standard drinks    Types: 4 Cans of beer per week    Comment: moderate per pt  . Drug use: No  . Sexual activity: Not on file  Other Topics Concern  . Not on file  Social History Narrative   Married with 2 children   Exercises regularly - bikes and swims   Social Determinants of Health   Financial Resource Strain:   . Difficulty of Paying Living Expenses:   Food Insecurity:   . Worried About Charity fundraiser in the Last Year:   . Arboriculturist in the Last Year:   Transportation Needs:   . Film/video editor (Medical):   Marland Kitchen Lack of Transportation (Non-Medical):   Physical Activity:   . Days of Exercise per Week:   . Minutes of  Exercise per Session:   Stress:   . Feeling of Stress :   Social Connections:   . Frequency of Communication with Friends and Family:   . Frequency of Social Gatherings with Friends and Family:   . Attends Religious Services:   . Active Member of Clubs or Organizations:   . Attends Archivist Meetings:   Marland Kitchen Marital Status:   Intimate Partner Violence:   . Fear of Current or Ex-Partner:   . Emotionally Abused:   Marland Kitchen Physically Abused:   . Sexually Abused:     Past Surgical History:  Procedure Laterality Date  . COLONOSCOPY    . COLONOSCOPY N/A 01/06/2015   Procedure: COLONOSCOPY;  Surgeon: Rogene Houston, MD;  Location[facility];  Service: Endoscopy;  Laterality: N/A;  41 - Dr. Laural Golden leaving around lunch for vactions  . ELBOW SURGERY     Tennis elbow  . ESOPHAGEAL DILATION N/A 05/02/2015   Procedure: ESOPHAGEAL DILATION;  Surgeon: Rogene Houston, MD;  Location:  [facility];  Service: Endoscopy;  Laterality: N/A;  . ESOPHAGOGASTRODUODENOSCOPY N/A 05/02/2015   Procedure: ESOPHAGOGASTRODUODENOSCOPY (EGD);  Surgeon: Rogene Houston, MD;  Location: [facility];  Service: Endoscopy;  Laterality: N/A;  1150  . ESOPHAGOGASTRODUODENOSCOPY (EGD) WITH ESOPHAGEAL DILATION  2007  . FINGER SURGERY     Trigger finger release  . Left shoulder reconstruction    . PROSTATECTOMY    . VASECTOMY        Current Outpatient Medications:  .  albuterol (VENTOLIN HFA) 108 (90 Base) MCG/ACT inhaler, Inhale 1-2 puffs into the lungs every 6 (six) hours as needed for wheezing or shortness of breath., Disp: 18 g, Rfl: 1 .  Cholecalciferol (VITAMIN D3) 5000 UNITS TABS, Take 5,000 Units by mouth daily., Disp: , Rfl:  .  ezetimibe (ZETIA) 10 MG tablet, Take 1 tablet (10 mg total) by mouth daily., Disp: 30 tablet, Rfl: 5 .  salmeterol (SEREVENT) 50 MCG/DOSE diskus inhaler, Inhale 1 puff into the lungs 2 (two) times daily., Disp: , Rfl:  .  simvastatin (ZOCOR) 5 MG tablet, Take 5 mg by mouth at bedtime., Disp: , Rfl:  .  SYNTHROID 150 MCG tablet, 150 mg daily and then 175 mg one day a week., Disp: , Rfl: 0    Physical Exam: Blood pressure 118/70, pulse (!) 58, height 6' (1.829 m), weight 169 lb (76.7 kg).    Affect appropriate Healthy:  appears stated age 39: normal Neck supple with no adenopathy JVP normal no bruits no thyromegaly Lungs clear with no wheezing and good diaphragmatic motion Heart:  S1/S2 SEM in aortic position and MR murmur at apex , no rub, gallop or click PMI normal Abdomen: benighn, BS positve, no tenderness, no AAA no bruit.  No HSM or HJR Distal pulses intact with no bruits No edema Neuro non-focal Skin warm and dry No muscular weakness   Labs:   Lab Results  Component Value Date   WBC 4.9 05/09/2009   HGB 12.1 (L) 05/13/2009   HCT 35.5 (L) 05/13/2009   MCV 92.9 05/09/2009   PLT 200 05/09/2009   No results for input(s): NA, K, CL,  CO2, BUN, CREATININE, CALCIUM, PROT, BILITOT, ALKPHOS, ALT, AST, GLUCOSE in the last 168 hours.  Invalid input(s): LABALBU No results found for: CKTOTAL, CKMB, CKMBINDEX, TROPONINI No results found for: CHOL No results found for: HDL No results found for: LDLCALC No results found for: TRIG No results found for: CHOLHDL No results found  for: LDLDIRECT    Radiology: DG Chest 2 View  Result Date: 03/23/2020 CLINICAL DATA:  Chest pain, unspecified type. Additional history provided: Left anterior chest pain toward sternum for 6 months. EXAM: CHEST - 2 VIEW COMPARISON:  Chest radiograph 01/10/2012 FINDINGS: Heart size within normal limits. No appreciable airspace consolidation within the lungs. Minimal linear atelectasis and/or scarring within the lateral left lung base. No evidence of pleural effusion or pneumothorax. No acute bony abnormality identified. Thoracic spondylosis. IMPRESSION: Minimal linear atelectasis and/or scarring within the lateral left lung base. No appreciable airspace consolidation. Thoracic spondylosis. Electronically Signed   By: Kellie Simmering DO   On: 03/23/2020 07:54    EKG: SR rate 58 normal    ASSESSMENT AND PLAN:   1. Hypothyroidism:  Continue replacement labs with primary  2. COPD:  Continue serevent and ventolin no active wheezing 3. HLD:  On Zetia/Statin. Labs with primary intolerant to statins  4. 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 Chest pain:  Atypical normal ecg  ETT ordered    Echo for murmur  ETT for chest pain   F/U in a year   Signed: Jenkins Rouge 04/05/2020, 11:21 AM

## 2020-04-05 ENCOUNTER — Other Ambulatory Visit: Payer: Self-pay

## 2020-04-05 ENCOUNTER — Encounter: Payer: Self-pay | Admitting: Cardiovascular Disease

## 2020-04-05 ENCOUNTER — Ambulatory Visit: Payer: Medicare HMO | Admitting: Cardiovascular Disease

## 2020-04-05 VITALS — BP 118/70 | HR 58 | Ht 72.0 in | Wt 169.0 lb

## 2020-04-05 DIAGNOSIS — I340 Nonrheumatic mitral (valve) insufficiency: Secondary | ICD-10-CM | POA: Diagnosis not present

## 2020-04-05 DIAGNOSIS — R079 Chest pain, unspecified: Secondary | ICD-10-CM

## 2020-04-05 NOTE — Patient Instructions (Signed)
Medication Instructions:  Your physician recommends that you continue on your current medications as directed. Please refer to the Current Medication list given to you today.  *If you need a refill on your cardiac medications before your next appointment, please call your pharmacy*   Lab Work: NONE   If you have labs (blood work) drawn today and your tests are completely normal, you will receive your results only by:  [employer] (if you have MyChart) OR  A paper copy in the mail If you have any lab test that is abnormal or we need to change your treatment, we will call you to review the results.   Testing/Procedures: Your physician has requested that you have an echocardiogram. Echocardiography is a painless test that uses sound waves to create images of your heart. It provides your doctor with information about the size and shape of your heart and how well your hearts chambers and valves are working. This procedure takes approximately one hour. There are no restrictions for this procedure.  Your physician has requested that you have an exercise tolerance test. For further information please visit [email or web address]. Please also follow instruction sheet, as given.  Follow-Up: At [facility], you and your health needs are our priority.  As part of our continuing mission to provide you with exceptional heart care, we have created designated Provider Care Teams.  These Care Teams include your primary Cardiologist (physician) and Advanced Practice Providers (APPs -  Physician Assistants and Nurse Practitioners) who all work together to provide you with the care you need, when you need it.  We recommend signing up for the patient portal called "MyChart".  Sign up information is provided on this After Visit Summary.  MyChart is used to connect with patients for Virtual Visits (Telemedicine).  Patients are able to view lab/test results, encounter notes, upcoming appointments, etc.   Non-urgent messages can be sent to your provider as well.   To learn more about what you can do with MyChart, go to [email or web address].    Your next appointment:   1 year(s)  The format for your next appointment:   In Person  Provider:   Jenkins Rouge, MD   Other Instructions Thank you for choosing [facility]!

## 2020-04-08 NOTE — Addendum Note (Signed)
Addended by: Carylon Perches on: 04/08/2020 11:38 AM   Modules accepted: Orders

## 2020-04-21 ENCOUNTER — Encounter (HOSPITAL_COMMUNITY): Payer: Medicare HMO

## 2020-04-22 ENCOUNTER — Telehealth: Payer: Self-pay | Admitting: Cardiovascular Disease

## 2020-04-22 NOTE — Telephone Encounter (Signed)
Left message that Covid test is no longer mandated.

## 2020-04-22 NOTE — Telephone Encounter (Signed)
Requesting to have COVID testing r/s -please call pt

## 2020-04-29 ENCOUNTER — Telehealth (INDEPENDENT_AMBULATORY_CARE_PROVIDER_SITE_OTHER): Payer: Self-pay | Admitting: *Deleted

## 2020-04-29 ENCOUNTER — Other Ambulatory Visit (HOSPITAL_COMMUNITY): Payer: Medicare HMO

## 2020-04-29 NOTE — Telephone Encounter (Signed)
Spoke with the patient, I agree with taking Dulcolax tabs x2 and Mag Citrate x1, but will need to continue taking Miralax 1 cap every day as part of his daily regimen while he is taking Ultram. Patient understood and agreed.  Harvel Quale, MD Gastroenterology and Hepatology

## 2020-04-29 NOTE — Telephone Encounter (Signed)
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 He is asking that for Dr.Rehman's recommendation for the following.  He has had a recent Left Hip and Knee injury.He has been taking the Tylenol #3 and then to Ultram. These medication have caused his bowels to shut down,his words.  He is thinking of using a TCS Prep - example Doculax Tabs and Mag Citrate. If there are any other suggestions he would appreciate a call back at [phone].

## 2020-05-02 ENCOUNTER — Ambulatory Visit (HOSPITAL_COMMUNITY): Payer: Medicare HMO

## 2020-05-02 NOTE — Telephone Encounter (Signed)
Noted  

## 2020-05-10 ENCOUNTER — Other Ambulatory Visit: Payer: Self-pay

## 2020-05-10 DIAGNOSIS — I340 Nonrheumatic mitral (valve) insufficiency: Secondary | ICD-10-CM

## 2020-05-13 ENCOUNTER — Ambulatory Visit (HOSPITAL_COMMUNITY): Admission: RE | Admit: 2020-05-13 | Payer: Medicare HMO | Source: Ambulatory Visit

## 2020-05-13 ENCOUNTER — Ambulatory Visit (HOSPITAL_COMMUNITY): Payer: Medicare HMO | Attending: Cardiovascular Disease

## 2020-05-23 ENCOUNTER — Ambulatory Visit: Payer: Medicare HMO | Admitting: Dermatology

## 2020-07-26 ENCOUNTER — Ambulatory Visit: Payer: Medicare HMO | Admitting: Dermatology

## 2020-07-26 ENCOUNTER — Encounter: Payer: Self-pay | Admitting: Dermatology

## 2020-07-26 ENCOUNTER — Other Ambulatory Visit: Payer: Self-pay

## 2020-07-26 DIAGNOSIS — Z85828 Personal history of other malignant neoplasm of skin: Secondary | ICD-10-CM | POA: Diagnosis not present

## 2020-07-26 DIAGNOSIS — D489 Neoplasm of uncertain behavior, unspecified: Secondary | ICD-10-CM

## 2020-07-26 DIAGNOSIS — C44329 Squamous cell carcinoma of skin of other parts of face: Secondary | ICD-10-CM

## 2020-07-26 DIAGNOSIS — L570 Actinic keratosis: Secondary | ICD-10-CM | POA: Diagnosis not present

## 2020-07-26 DIAGNOSIS — D485 Neoplasm of uncertain behavior of skin: Secondary | ICD-10-CM

## 2020-07-26 NOTE — Patient Instructions (Addendum)
Biopsy, Surgery (Curettage) & Surgery (Excision) Aftercare Instructions  1. Okay to remove bandage in 24 hours  2. Wash area with soap and water  3. Apply Vaseline to area twice daily until healed (Not Neosporin)  4. Okay to cover with a Band-Aid to decrease the chance of infection or prevent irritation from clothing; also it's okay to uncover lesion at home.  5. Suture instructions: return to our office in 7-10 or 10-14 days for a nurse visit for suture removal. Variable healing with sutures, if pain or itching occurs call our office. It's okay to shower or bathe 24 hours after sutures are given.  6. The following risks may occur after a biopsy, curettage or excision: bleeding, scarring, discoloration, recurrence, infection (redness, yellow drainage, pain or swelling).  7. For questions, concerns and results call our office at [place] before 4pm & Friday before 3pm. Biopsy results will be available in 1 week.  Follow-up visit for Walter Taylor date of birth 11-05-1945.  Of chief concern is a slowly growing moderately thick crust on the right back cheek which may represent carcinoma in situ.  A fair sized shave biopsy obtained and please call the office on Monday to discuss the results.  3 smaller precancers on the left cheek treated with 5-second liquid nitrogen freeze; these may swell and peel in the next 2weeks.  If the right cheek is superficial cancer this will have priority over the other spots.  If there is no cancer, you can consider either 28 home applications of the newest fluorouracil cream called Tolak or a new form of PDT called red light therapy which is actually used in [place] to treat superficial cancers. there are no restrictions in activity.  After bathing put a little triple antibiotic or if you are allergic, plain Vaseline on the biopsy site.

## 2020-07-26 NOTE — Progress Notes (Addendum)
    Subjective  Walter Taylor is a 74 y.o. male who presents for the following: Annual Exam (LEFT AND RIGHT CHEEK PINK SCALE X MONTHS).  Growth Location: Growth right cheek Duration:  Quality:  Associated Signs/Symptoms: Modifying Factors:  Severity:  Timing: Context: History of nonmobile skin cancer  Objective  Well appearing patient in no apparent distress; mood and affect are within normal limits.  All skin waist up examined.   Assessment & Plan   Tangential biopsy of right cheek, patient consent obtained, Hibiclens prep, 1% lidocaine with epinephrine, shave biopsy, hemostasis with Monsel solution, and no complications, sterile petrolatum dressing. Neoplasm of uncertain behavior Right Cheek Area  Specimen 1 - Surgical pathology Differential Diagnosis: SCC BCC Check Margins: No  AK (actinic keratosis) (3) Left Buccal Cheek   Destruction of lesion - Left Buccal Cheek  Complexity: simple   Destruction method: cryotherapy   Informed consent: discussed and consent obtained   Lesion destroyed using liquid nitrogen: Yes   Cryotherapy cycles:  5 Outcome: patient tolerated procedure well with no complications     Follow-up visit for Walter Taylor date of birth 03-06-1946.  Of chief concern is a slowly growing moderately thick crust on the right back cheek which may represent carcinoma in situ.  A fair sized shave biopsy obtained and please call the office on Monday to discuss the results.  3 smaller precancers on the left cheek treated with 5-second liquid nitrogen freeze; these may swell and peel in the next 2weeks.  If the right cheek is superficial cancer this will have priority over the other spots.  If there is no cancer, you can consider either 28 home applications of the newest fluorouracil cream called Tolak or a new form of PDT called red light therapy which is actually used in [place] to treat superficial cancers. there are no restrictions in activity.  After bathing  put a little triple antibiotic or if you are allergic, plain Vaseline on the biopsy site.  I, Lavonna Monarch, MD, have reviewed all documentation for this visit.  The documentation on 09/02/20 for the exam, diagnosis, procedures, and orders are all accurate and complete. on left cheek

## 2020-08-01 ENCOUNTER — Telehealth: Payer: Self-pay | Admitting: *Deleted

## 2020-08-01 NOTE — Telephone Encounter (Signed)
Left message for patient to call for pathology results

## 2020-08-01 NOTE — Telephone Encounter (Signed)
Path to patient. Made a surgery appointment with Dr.Tafeen.  

## 2020-08-01 NOTE — Telephone Encounter (Signed)
-----   Message from Lavonna Monarch, MD sent at 07/29/2020 10:55 AM EDT ----- Schedule surgery with Dr. Darene Lamer

## 2020-08-14 ENCOUNTER — Other Ambulatory Visit: Payer: Self-pay | Admitting: Family Medicine

## 2020-08-14 DIAGNOSIS — E785 Hyperlipidemia, unspecified: Secondary | ICD-10-CM

## 2020-09-02 ENCOUNTER — Encounter: Payer: Self-pay | Admitting: Dermatology

## 2020-09-02 NOTE — Progress Notes (Addendum)
I, Lavonna Monarch, MD, have reviewed all documentation for this visit. The documentation on 09/07/20 for the exam, diagnosis, procedures, and orders are all accurate and complete.I, Lavonna Monarch, MD, have reviewed all documentation for this visit. The documentation on 09/07/20 for the exam, diagnosis, procedures, and orders are all accurate and complete.

## 2020-09-07 NOTE — Addendum Note (Signed)
Addended by: Lavonna Monarch on: 09/07/2020 05:52 AM   Modules accepted: Orders

## 2020-10-20 ENCOUNTER — Ambulatory Visit (INDEPENDENT_AMBULATORY_CARE_PROVIDER_SITE_OTHER): Payer: Medicare HMO | Admitting: Dermatology

## 2020-10-20 ENCOUNTER — Other Ambulatory Visit: Payer: Self-pay

## 2020-10-20 ENCOUNTER — Encounter: Payer: Self-pay | Admitting: Dermatology

## 2020-10-20 DIAGNOSIS — C4492 Squamous cell carcinoma of skin, unspecified: Secondary | ICD-10-CM

## 2020-10-20 DIAGNOSIS — C44329 Squamous cell carcinoma of skin of other parts of face: Secondary | ICD-10-CM

## 2020-10-20 NOTE — Patient Instructions (Signed)

## 2020-10-23 ENCOUNTER — Encounter: Payer: Self-pay | Admitting: Dermatology

## 2020-10-23 NOTE — Progress Notes (Signed)
   Follow-Up Visit   Subjective  Walter Taylor is a 75 y.o. male who presents for the following: Procedure (Patient here today for treatment SCC x 1 right cheek area). Carcinoma right cheek Location:  Duration:  Quality:  Associated Signs/Symptoms: Modifying Factors:  Severity:  Timing: Context: For treatment  Objective  Well appearing patient in no apparent distress; mood and affect are within normal limits. Objective  Right Cheek Area: Lesion identified by Dr.Lizeth Bencosme and nurse in room.     A focused examination was performed including Head and neck. Relevant physical exam findings are noted in the Assessment and Plan.   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7 Hemostasis achieved with:  ferric subsulfate Outcome: patient tolerated procedure well with no complications   Post-procedure details: sterile dressing applied and wound care instructions given   Dressing type: bandage and petrolatum   Additional details:  Wound innoculated with 5 fluorouracil solution.     I, Janalyn Harder, MD, have reviewed all documentation for this visit.  The documentation on 10/23/20 for the exam, diagnosis, procedures, and orders are all accurate and complete.

## 2021-03-15 ENCOUNTER — Ambulatory Visit (HOSPITAL_COMMUNITY)
Admission: RE | Admit: 2021-03-15 | Discharge: 2021-03-15 | Disposition: A | Discharge status: Home / Self Care | Payer: Medicare HMO | Source: Ambulatory Visit | Attending: Cardiovascular Disease | Admitting: Cardiovascular Disease

## 2021-03-15 ENCOUNTER — Other Ambulatory Visit: Payer: Self-pay

## 2021-03-15 ENCOUNTER — Ambulatory Visit (HOSPITAL_BASED_OUTPATIENT_CLINIC_OR_DEPARTMENT_OTHER)
Admission: RE | Admit: 2021-03-15 | Discharge: 2021-03-15 | Disposition: A | Discharge status: Home / Self Care | Payer: Medicare HMO | Source: Ambulatory Visit | Attending: Cardiovascular Disease | Admitting: Cardiovascular Disease

## 2021-03-15 DIAGNOSIS — R079 Chest pain, unspecified: Secondary | ICD-10-CM | POA: Diagnosis not present

## 2021-03-15 DIAGNOSIS — J449 Chronic obstructive pulmonary disease, unspecified: Secondary | ICD-10-CM | POA: Diagnosis not present

## 2021-03-15 DIAGNOSIS — I700 Atherosclerosis of aorta: Secondary | ICD-10-CM | POA: Diagnosis not present

## 2021-03-15 DIAGNOSIS — E785 Hyperlipidemia, unspecified: Secondary | ICD-10-CM | POA: Diagnosis not present

## 2021-03-15 DIAGNOSIS — I340 Nonrheumatic mitral (valve) insufficiency: Secondary | ICD-10-CM

## 2021-03-15 LAB — EXERCISE TOLERANCE TEST
Peak HR: 129 {beats}/min
Rest HR: 55 {beats}/min

## 2021-03-15 LAB — ECHOCARDIOGRAM COMPLETE
Area-P 1/2: 2.79 cm2
S' Lateral: 2.5 cm

## 2021-03-15 NOTE — Progress Notes (Signed)
*  PRELIMINARY RESULTS* Echocardiogram 2D Echocardiogram has been performed.  Walter Taylor 03/15/2021, 9:19 AM

## 2021-04-05 ENCOUNTER — Ambulatory Visit: Payer: Medicare HMO | Admitting: Cardiovascular Disease

## 2021-04-25 ENCOUNTER — Ambulatory Visit: Payer: Medicare HMO | Admitting: Cardiovascular Disease

## 2021-05-11 ENCOUNTER — Other Ambulatory Visit (HOSPITAL_COMMUNITY): Payer: Self-pay | Admitting: Student

## 2021-05-11 DIAGNOSIS — J4520 Mild intermittent asthma, uncomplicated: Secondary | ICD-10-CM

## 2021-05-22 ENCOUNTER — Other Ambulatory Visit (HOSPITAL_COMMUNITY): Payer: Self-pay | Admitting: Radiology

## 2021-05-22 DIAGNOSIS — J4520 Mild intermittent asthma, uncomplicated: Secondary | ICD-10-CM

## 2021-06-16 ENCOUNTER — Other Ambulatory Visit (HOSPITAL_COMMUNITY)
Admission: RE | Admit: 2021-06-16 | Discharge: 2021-06-16 | Disposition: A | Discharge status: Home / Self Care | Payer: Medicare HMO | Source: Ambulatory Visit | Attending: Internal Medicine | Admitting: Internal Medicine

## 2021-06-16 ENCOUNTER — Other Ambulatory Visit (HOSPITAL_COMMUNITY): Payer: Medicare HMO

## 2021-06-16 ENCOUNTER — Other Ambulatory Visit: Payer: Self-pay

## 2021-06-16 DIAGNOSIS — Z20822 Contact with and (suspected) exposure to covid-19: Secondary | ICD-10-CM | POA: Insufficient documentation

## 2021-06-16 DIAGNOSIS — Z01812 Encounter for preprocedural laboratory examination: Secondary | ICD-10-CM | POA: Diagnosis present

## 2021-06-16 LAB — SARS CORONAVIRUS 2 (TAT 6-24 HRS): SARS Coronavirus 2: NEGATIVE

## 2021-06-20 ENCOUNTER — Other Ambulatory Visit: Payer: Self-pay

## 2021-06-20 ENCOUNTER — Ambulatory Visit (HOSPITAL_COMMUNITY): Payer: Medicare HMO

## 2021-06-20 ENCOUNTER — Ambulatory Visit (HOSPITAL_COMMUNITY)
Admission: RE | Admit: 2021-06-20 | Discharge: 2021-06-20 | Disposition: A | Discharge status: Home / Self Care | Payer: Medicare HMO | Source: Ambulatory Visit | Attending: Internal Medicine | Admitting: Internal Medicine

## 2021-06-20 DIAGNOSIS — J4520 Mild intermittent asthma, uncomplicated: Secondary | ICD-10-CM | POA: Insufficient documentation

## 2021-06-20 LAB — PULMONARY FUNCTION TEST
DL/VA % pred: 102 %
DL/VA: 4.02 ml/min/mmHg/L
DLCO unc % pred: 96 %
DLCO unc: 25.55 ml/min/mmHg
FEF 25-75 Post: 2.25 L/sec
FEF 25-75 Pre: 1.8 L/sec
FEF2575-%Change-Post: 25 %
FEF2575-%Pred-Post: 95 %
FEF2575-%Pred-Pre: 76 %
FEV1-%Change-Post: 4 %
FEV1-%Pred-Post: 89 %
FEV1-%Pred-Pre: 85 %
FEV1-Post: 2.94 L
FEV1-Pre: 2.82 L
FEV1FVC-%Change-Post: 0 %
FEV1FVC-%Pred-Pre: 98 %
FEV6-%Change-Post: 3 %
FEV6-%Pred-Post: 93 %
FEV6-%Pred-Pre: 90 %
FEV6-Post: 3.98 L
FEV6-Pre: 3.85 L
FEV6FVC-%Change-Post: 0 %
FEV6FVC-%Pred-Post: 102 %
FEV6FVC-%Pred-Pre: 103 %
FVC-%Change-Post: 3 %
FVC-%Pred-Post: 90 %
FVC-%Pred-Pre: 87 %
FVC-Post: 4.11 L
FVC-Pre: 3.96 L
Post FEV1/FVC ratio: 72 %
Post FEV6/FVC ratio: 97 %
Pre FEV1/FVC ratio: 71 %
Pre FEV6/FVC Ratio: 97 %
RV % pred: 155 %
RV: 4.17 L
TLC % pred: 109 %
TLC: 8.12 L

## 2021-06-20 MED ORDER — ALBUTEROL SULFATE (2.5 MG/3ML) 0.083% IN NEBU
2.5000 mg | INHALATION_SOLUTION | Freq: Once | RESPIRATORY_TRACT | Status: AC
  Administered 2021-06-20: 2.5 mg via RESPIRATORY_TRACT

## 2021-06-28 NOTE — Progress Notes (Signed)
Synopsis: Referred for asthma by Donnamae Jude, FNP  Subjective:   PATIENT ID: Walter Taylor GENDER: male DOB: May 13, 1946, MRN: [id number]  Chief Complaint  Patient presents with   Pulmonary Consult    Referred by Eloy End, FNP. Pt states dx for Asthma approx 20 years ago.    75yM with history of asthma - never had it as a kid he says though, smoking 1 ppd 15-18 years quit 74 ya, GERD who is referred for mild intermittent asthma.  He says that he's getting more short of breath with cycling. He has more difficulty keeping up with his group rides over the last year, especially over last 6 months. He has a little cough but more bothered by dyspnea. No accompanying throat tightness. He has had some worsening heartburn recently and takes tums. Has seasonal sinonasal congestion which is active.   Had an echo this past summer. Low risk stress this past summer as well.   Otherwise pertinent review of systems is negative.  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 Born in [facility] but has never lived outside of [place] otherwise.   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 Laural Golden leaving around lunch for vactions   ELBOW SURGERY     Tennis elbow   ESOPHAGEAL DILATION N/A 05/02/2015   Procedure: ESOPHAGEAL DILATION;  Surgeon: Rogene Houston, MD;  [facility];  Service: Endoscopy;  Laterality: N/A;   ESOPHAGOGASTRODUODENOSCOPY N/A 05/02/2015   Procedure: ESOPHAGOGASTRODUODENOSCOPY (EGD);  Surgeon: Rogene Houston, MD;  [facility];  Service: Endoscopy;  Laterality: N/A;  1150   ESOPHAGOGASTRODUODENOSCOPY (EGD) WITH ESOPHAGEAL DILATION  2007   FINGER SURGERY     Trigger finger release   Left shoulder reconstruction     PROSTATECTOMY     VASECTOMY      Social History   Socioeconomic History   Marital status: Married    Spouse name: Not on file   Number of children: Not on file   Years of education: Not on file   Highest education level: Not on file  Occupational  History   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  Tobacco Use   Smoking status: Former    Packs/day: 1.00    Years: 15.00    Pack years: 15.00    Types: Cigarettes    Quit date: 10/22/1978    Years since quitting: 42.7   Smokeless tobacco: Never  Vaping Use   Vaping Use: Never used  Substance and Sexual Activity   Alcohol use: Yes    Alcohol/week: 4.0 standard drinks    Types: 4 Cans of beer per week    Comment: moderate per pt   Drug use: No   Sexual activity: Not on file  Other Topics Concern   Not on file  Social History Narrative   Married with 2 children   Exercises regularly - bikes and swims   Social Determinants of Health   Financial Resource Strain: Not on file  Food Insecurity: Not on file  Transportation Needs: Not on file  Physical  Activity: Not on file  Stress: Not on file  Social Connections: Not on file  Intimate Partner Violence: Not on file     Allergies  Allergen Reactions   Erythromycin Base Other (See Comments)   Sulfa Antibiotics Other (See Comments)   Erythromycin Other (See Comments)    Liver pain   Nsaids Other (See Comments)    Liver pain   Other Swelling   Pollen Extract Swelling   Statins Other (See Comments)    Liver pain     Outpatient Medications Prior to Visit  Medication Sig Dispense Refill   albuterol (VENTOLIN HFA) 108 (90 Base) MCG/ACT inhaler Inhale 1-2 puffs into the lungs every 6 (six) hours as needed for wheezing or shortness of breath. 18 g 1   Cholecalciferol (VITAMIN D3) 5000 UNITS TABS Take 5,000 Units by mouth daily.     ezetimibe (ZETIA) 10 MG tablet Take 1 tablet (10 mg total) by mouth daily. 30 tablet 5   salmeterol (SEREVENT) 50 MCG/DOSE diskus inhaler Inhale 1 puff into the lungs 2 (two) times daily.     simvastatin (ZOCOR) 5 MG tablet Take 5 mg by mouth at bedtime.     SYNTHROID 150 MCG tablet 150 mg daily and then 175 mg one day a week.  0   Tiotropium Bromide-Olodaterol (STIOLTO RESPIMAT) 2.5-2.5 MCG/ACT AERS Inhale 2 puffs into the lungs daily.     traMADol (ULTRAM) 50 MG tablet Take 50 mg by mouth every 6 (six) hours as needed.     No facility-administered medications prior to visit.       Objective:   Physical Exam:  General appearance: 75 y.o., male, NAD, male, NAD, conversant  Eyes: anicteric sclerae, moist conjunctivae; no lid-lag; PERRL, tracking appropriately HENT: NCAT; oropharynx, MMM, no mucosal ulcerations; normal hard and soft palate Neck: Trachea midline; no lymphadenopathy, no JVD Lungs: CTAB, no crackles, no wheeze, with normal respiratory effort CV: RRR, no MRGs  Abdomen: Soft, non-tender; non-distended, BS present  Extremities: No peripheral edema, radial and DP pulses present bilaterally  Skin: Normal temperature, turgor and texture; no  rash Psych: Appropriate affect Neuro: Alert and oriented to person and place, no focal deficit    Vitals:   06/29/21 1542  BP: 130/72  Pulse: 65  Temp: 98.2 F (36.8 C)  TempSrc: Oral  SpO2: 98%  Weight: 170 lb (77.1 kg)  Height: 6' (1.829 m)   98% on RA BMI Readings from Last 3 Encounters:  06/29/21 23.06 kg/m  04/05/20 22.92 kg/m  02/03/20 23.00 kg/m  Wt Readings from Last 3 Encounters:  06/29/21 170 lb (77.1 kg)  04/05/20 169 lb (76.7 kg)  02/03/20 169 lb 9.6 oz (76.9 kg)     CBC    Component Value Date/Time   WBC 4.9 05/09/2009 0905   RBC 4.54 05/09/2009 0905   HGB 12.1 (L) 05/13/2009 0530   HCT 35.5 (L) 05/13/2009 0530   PLT 200 05/09/2009 0905   MCV 92.9 05/09/2009 0905   MCHC 33.6 05/09/2009 0905   RDW 13.3 05/09/2009 0905      Chest Imaging: CXR 03/22/20 reviewed by me with L lung base scarring otherwise unremakrable  Pulmonary Functions Testing Results: PFT Results Latest Ref Rng & Units 05/22/2021  FVC-Pre L 3.96  FVC-Predicted Pre % 87  FVC-Post L 4.11  FVC-Predicted Post % 90  Pre FEV1/FVC % % 71  Post FEV1/FCV % % 72  FEV1-Pre L 2.82  FEV1-Predicted Pre % 85  FEV1-Post L 2.94  DLCO uncorrected ml/min/mmHg 25.55  DLCO UNC% % 96  DLVA Predicted % 102  TLC L 8.12  TLC % Predicted % 109  RV % Predicted % 155   +Air trapping    Echocardiogram:   TTE 5/25 unremarkable     Assessment & Plan:   # Dyspnea on exertion: sounds suspicious for asthma and he does have air trapping on recent PFT (obstruction may have been masked by serevent use that day as well). If he has asthma then it sounds like GERD is likely playing significant role.   Plan: - CXR PA/lateral - cbc/diff, IgE - declines LABA/ICS due to pneumonia when on dulera in past - will trial anoro since he's had good symptomatic response to LABA/LAMA sample - omeprazole 40 mg daily every morning 30 minutes before eating      Maryjane Hurter, MD [facility] 06/29/2021 4:19 PM

## 2021-06-29 ENCOUNTER — Other Ambulatory Visit: Payer: Self-pay

## 2021-06-29 ENCOUNTER — Ambulatory Visit (INDEPENDENT_AMBULATORY_CARE_PROVIDER_SITE_OTHER): Payer: Medicare HMO

## 2021-06-29 ENCOUNTER — Ambulatory Visit: Payer: Medicare HMO | Admitting: Student

## 2021-06-29 ENCOUNTER — Encounter: Payer: Self-pay | Admitting: Student

## 2021-06-29 VITALS — BP 130/72 | HR 65 | Temp 98.2°F | Ht 72.0 in | Wt 170.0 lb

## 2021-06-29 DIAGNOSIS — R0609 Other forms of dyspnea: Secondary | ICD-10-CM

## 2021-06-29 DIAGNOSIS — R0600 Dyspnea, unspecified: Secondary | ICD-10-CM

## 2021-06-29 MED ORDER — ANORO ELLIPTA 62.5-25 MCG/INH IN AEPB: 1.0000 | INHALATION_SPRAY | Freq: Every day | RESPIRATORY_TRACT | 0 refills | Status: DC

## 2021-06-29 MED ORDER — OMEPRAZOLE 40 MG PO CPDR: 40.0000 mg | DELAYED_RELEASE_CAPSULE | Freq: Every day | ORAL | 3 refills | Status: DC

## 2021-06-29 NOTE — Patient Instructions (Addendum)
-   CXR pa/lateral, labs today - start anoro 1 puff once daily - start omeprazole 40 mg every morning 30 minutes before eating

## 2021-06-29 NOTE — Addendum Note (Signed)
Addended by: Suzzanne Cloud E on: 06/29/2021 04:41 PM   Modules accepted: Orders

## 2021-06-30 LAB — CBC WITH DIFFERENTIAL/PLATELET
Basophils Absolute: 0 10*3/uL (ref 0.0–0.1)
Basophils Relative: 0.5 % (ref 0.0–3.0)
Eosinophils Absolute: 0.1 10*3/uL (ref 0.0–0.7)
Eosinophils Relative: 1.1 % (ref 0.0–5.0)
HCT: 38.8 % — ABNORMAL LOW (ref 39.0–52.0)
Hemoglobin: 13.1 g/dL (ref 13.0–17.0)
Lymphocytes Relative: 43.4 % (ref 12.0–46.0)
Lymphs Abs: 2.1 10*3/uL (ref 0.7–4.0)
MCHC: 33.6 g/dL (ref 30.0–36.0)
MCV: 91.7 fl (ref 78.0–100.0)
Monocytes Absolute: 0.5 10*3/uL (ref 0.1–1.0)
Monocytes Relative: 10.8 % (ref 3.0–12.0)
Neutro Abs: 2.2 10*3/uL (ref 1.4–7.7)
Neutrophils Relative %: 44.2 % (ref 43.0–77.0)
Platelets: 200 10*3/uL (ref 150.0–400.0)
RBC: 4.23 Mil/uL (ref 4.22–5.81)
RDW: 13.5 % (ref 11.5–15.5)
WBC: 4.9 10*3/uL (ref 4.0–10.5)

## 2021-06-30 LAB — IGE: IgE (Immunoglobulin E), Serum: 27 kU/L (ref <=114)

## 2021-08-02 ENCOUNTER — Other Ambulatory Visit: Payer: Self-pay

## 2021-08-02 MED ORDER — ANORO ELLIPTA 62.5-25 MCG/INH IN AEPB: 1.0000 | INHALATION_SPRAY | Freq: Every day | RESPIRATORY_TRACT | 6 refills | Status: DC

## 2021-08-14 ENCOUNTER — Encounter: Payer: Self-pay | Admitting: Dermatology

## 2021-08-14 ENCOUNTER — Other Ambulatory Visit: Payer: Self-pay

## 2021-08-14 ENCOUNTER — Ambulatory Visit: Payer: Medicare HMO | Admitting: Dermatology

## 2021-08-14 DIAGNOSIS — R21 Rash and other nonspecific skin eruption: Secondary | ICD-10-CM

## 2021-08-14 DIAGNOSIS — L570 Actinic keratosis: Secondary | ICD-10-CM

## 2021-08-14 DIAGNOSIS — Z1283 Encounter for screening for malignant neoplasm of skin: Secondary | ICD-10-CM

## 2021-08-14 DIAGNOSIS — Z8589 Personal history of malignant neoplasm of other organs and systems: Secondary | ICD-10-CM | POA: Diagnosis not present

## 2021-08-14 MED ORDER — TOLAK 4 % EX CREA: TOPICAL_CREAM | CUTANEOUS | 0 refills | Status: DC

## 2021-08-14 MED ORDER — HYDROCORTISONE 2.5 % EX CREA: TOPICAL_CREAM | Freq: Two times a day (BID) | CUTANEOUS | 1 refills | Status: DC | PRN

## 2021-08-14 NOTE — Patient Instructions (Signed)
[place] [phone]

## 2021-08-27 ENCOUNTER — Encounter: Payer: Self-pay | Admitting: Dermatology

## 2021-08-27 NOTE — Progress Notes (Signed)
   Follow-Up Visit   Subjective  Walter Taylor is a 75 y.o. male who presents for the following: Follow-up (Personal history of scc, right temple crusty lesion).  History of skin cancer, peeling lip, check spots on back. Location:  Duration:  Quality:  Associated Signs/Symptoms: Modifying Factors:  Severity:  Timing: Context:   Objective  Well appearing patient in no apparent distress; mood and affect are within normal limits. Skin examination, no atypical pigmented lesions, new or recurrent nonmelanoma skin cancer  Mid Back, Mid Lower [place]ilion Lip Subtle general cheilitis without palpable nodules or erosions.  Likely a mixture of minor UV damage plus inflammation.  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  Right Buccal Cheek Follow  up scc treated in December, full body skin check today per patient feet clear  Head - Anterior (Face), Scalp Multiple small pink gritty crusts    A full examination was performed including scalp, head, eyes, ears, nose, lips, neck, chest, axillae, abdomen, back, buttocks, bilateral upper extremities, bilateral lower extremities, hands, feet, fingers, toes, fingernails, and toenails. All findings within normal limits unless otherwise noted below.   Assessment & Plan    Rash Mid Back; Mid Lower Vermilion Lip  hydrocortisone 2.5 % cream - Mid Back, Mid Lower Vermilion Lip Apply topically 2 (two) times daily as needed (Rash). Apply to lips and back 1-2 times daily  History of squamous cell carcinoma Right Buccal Cheek  Follow up annually  AK (actinic keratosis) (2) Head - Anterior (Face); Scalp  Patient will use topical [place] x 28 applications.  Related Medications Fluorouracil (TOLAK) 4 % CREA Apply to affected area nightly x 28 applications.  Encounter for screening for malignant neoplasm of skin  Annual skin examination.  Encouraged to self examine with spouse twice annually.      I,  Lavonna Monarch, MD, have reviewed all documentation for this visit.  The documentation on 08/27/21 for the exam, diagnosis, procedures, and orders are all accurate and complete.

## 2021-08-28 ENCOUNTER — Encounter: Payer: Self-pay | Admitting: Dermatology

## 2021-08-31 ENCOUNTER — Encounter: Payer: Self-pay | Admitting: Dermatology

## 2021-08-31 ENCOUNTER — Telehealth: Payer: Self-pay | Admitting: Dermatology

## 2021-08-31 NOTE — Telephone Encounter (Signed)
Still has "damaged skin". Lips no longer cracked or bleeding.' Says he sent ST a message via mychart to this affect. Wants to know what the next step is.

## 2021-10-20 ENCOUNTER — Other Ambulatory Visit: Payer: Self-pay | Admitting: Student

## 2021-10-30 ENCOUNTER — Ambulatory Visit: Payer: Medicare HMO | Admitting: Student

## 2021-11-13 NOTE — Progress Notes (Signed)
Synopsis: Referred for asthma by Celene Squibb, MD  Subjective:   PATIENT ID: Walter Taylor GENDER: male DOB: 1946-10-13, MRN: [id number]  Chief Complaint  Patient presents with   Follow-up    Breathing is much improved and his cough has resolved. He is not having to use his albuterol.    76yM with history of asthma - never had it as a kid he says though, smoking 1 ppd 15-18 years quit 6 ya, GERD who is referred for mild intermittent asthma.  He says that he's getting more short of breath with cycling. He has more difficulty keeping up with his group rides over the last year, especially over last 6 months. He has a little cough but more bothered by dyspnea. No accompanying throat tightness. He has had some worsening heartburn recently and takes tums. Has seasonal sinonasal congestion which is active.   Had an echo this past summer. Low risk stress this past summer as well.   Otherwise pertinent review of systems is negative.  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 Born in [facility] but has never lived outside of [place] otherwise.   Interval HPI: Last seen by me 06/29/21 at which point started anoro and omeprazole. Cough improved drastically with ppi. DOE seems much improved with anoro. Hasn't yet tried many lifestyle measures to work on GERD.  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   superficial right post shoulder (CX3, cuatery, 5FU)   BCC (basal cell carcinoma) 07/07/2013   left outer brow (CX3, 5FU)   BCC (basal cell carcinoma) 06/07/2014   right ear (CX3, 5FU)   BCC (basal cell carcinoma) 09/19/2016   nod, right side of neck (CX3, 5FU)   Cancer (HCC)    Prostate cancer   Emphysema lung (HCC)    GERD (gastroesophageal reflux disease)    occasionally   Heart murmur    Hyperlipidemia    Hypothyroidism     Osteoporosis    Palpitations    Hx of   SCC (squamous cell carcinoma) 06/07/2014   well diff, left temple (CX3, 5FU)   Shortness of breath dyspnea    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 Laural Golden leaving around lunch for vactions   ELBOW SURGERY     Tennis elbow   ESOPHAGEAL DILATION N/A 05/02/2015   Procedure: ESOPHAGEAL DILATION;  Surgeon: Rogene Houston, MD;  [facility];  Service: Endoscopy;  Laterality: N/A;   ESOPHAGOGASTRODUODENOSCOPY N/A 05/02/2015   Procedure: ESOPHAGOGASTRODUODENOSCOPY (EGD);  Surgeon: Rogene Houston, MD;  [facility];  Service: Endoscopy;  Laterality: N/A;  1150   ESOPHAGOGASTRODUODENOSCOPY (EGD) WITH ESOPHAGEAL DILATION  2007   FINGER SURGERY     Trigger finger release   Left shoulder reconstruction     PROSTATECTOMY     VASECTOMY      Social History   Socioeconomic History  Marital status: Married    Spouse name: Not on file   Number of children: Not on file   Years of education: Not on file   Highest education level: Not on file  Occupational History   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  Tobacco Use   Smoking status: Former    Packs/day: 1.00    Years: 15.00    Pack years: 15.00    Types: Cigarettes    Quit date: 10/22/1978    Years since quitting: 43.0   Smokeless tobacco: Never  Vaping Use   Vaping Use: Never used  Substance and Sexual Activity   Alcohol use: Yes    Alcohol/week: 4.0 standard drinks    Types: 4 Cans of beer per week    Comment: moderate per pt   Drug use: No   Sexual activity: Not on file  Other Topics Concern   Not on file  Social  History Narrative   Married with 2 children   Exercises regularly - bikes and swims   Social Determinants of Health   Financial Resource Strain: Not on file  Food Insecurity: Not on file  Transportation Needs: Not on file  Physical Activity: Not on file  Stress: Not on file  Social Connections: Not on file  Intimate Partner Violence: Not on file     Allergies  Allergen Reactions   Erythromycin Base Other (See Comments)   Erythromycin Other (See Comments)    Liver pain   Nsaids Other (See Comments)    Liver pain   Other Swelling   Pollen Extract Swelling   Statins Other (See Comments)    Liver pain     Outpatient Medications Prior to Visit  Medication Sig Dispense Refill   albuterol (VENTOLIN HFA) 108 (90 Base) MCG/ACT inhaler Inhale 1-2 puffs into the lungs every 6 (six) hours as needed for wheezing or shortness of breath. 18 g 1   Cholecalciferol (VITAMIN D3) 5000 UNITS TABS Take 5,000 Units by mouth daily.     ezetimibe (ZETIA) 10 MG tablet Take 1 tablet (10 mg total) by mouth daily. 30 tablet 5   Fluorouracil (TOLAK) 4 % CREA Apply to affected area nightly x 28 applications. 40 g 0   hydrocortisone 2.5 % cream Apply topically 2 (two) times daily as needed (Rash). Apply to lips and back 1-2 times daily 30 g 1   simvastatin (ZOCOR) 5 MG tablet Take 5 mg by mouth at bedtime.     SYNTHROID 150 MCG tablet 150 mg daily and then 175 mg one day a week.  0   traMADol (ULTRAM) 50 MG tablet Take 50 mg by mouth every 6 (six) hours as needed.     omeprazole (PRILOSEC) 40 MG capsule TAKE 1 CAPSULE(40 MG) BY MOUTH DAILY 30 capsule 3   umeclidinium-vilanterol (ANORO ELLIPTA) 62.5-25 MCG/INH AEPB Inhale 1 puff into the lungs daily. 30 each 6   salmeterol (SEREVENT) 50 MCG/DOSE diskus inhaler Inhale 1 puff into the lungs 2 (two) times daily.     Tiotropium Bromide-Olodaterol (STIOLTO RESPIMAT) 2.5-2.5 MCG/ACT AERS Inhale 2 puffs into the lungs daily.     No facility-administered  medications prior to visit.       Objective:   Physical Exam:  General appearance: 76 y.o., male, male, NAD, conversant  Eyes: anicteric sclerae; PERRL, tracking appropriately HENT: NCAT; MMM Neck: Trachea midline; no lymphadenopathy, no JVD Lungs: CTAB, no crackles, no wheeze, with normal respiratory effort CV: RRR, +systolic murmur without radiation Abdomen: Soft, non-tender; non-distended,  BS present  Extremities: No peripheral edema, warm Skin: Normal turgor and texture; no rash Psych: Appropriate affect Neuro: Alert and oriented to person and place, no focal deficit     Vitals:   11/15/21 1104  BP: 126/70  Pulse: (!) 55  Temp: (!) 97.4 F (36.3 C)  TempSrc: Oral  SpO2: 99%  Weight: 174 lb 6.4 oz (79.1 kg)  Height: 6' (1.829 m)    99% on RA BMI Readings from Last 3 Encounters:  11/15/21 23.65 kg/m  06/29/21 23.06 kg/m  04/05/20 22.92 kg/m   Wt Readings from Last 3 Encounters:  11/15/21 174 lb 6.4 oz (79.1 kg)  06/29/21 170 lb (77.1 kg)  04/05/20 169 lb (76.7 kg)     CBC    Component Value Date/Time   WBC 4.9 06/29/2021 1642   RBC 4.23 06/29/2021 1642   HGB 13.1 06/29/2021 1642   HCT 38.8 (L) 06/29/2021 1642   PLT 200.0 06/29/2021 1642   MCV 91.7 06/29/2021 1642   MCHC 33.6 06/29/2021 1642   RDW 13.5 06/29/2021 1642   LYMPHSABS 2.1 06/29/2021 1642   MONOABS 0.5 06/29/2021 1642   EOSABS 0.1 06/29/2021 1642   BASOSABS 0.0 06/29/2021 1642      Chest Imaging: CXR 03/22/20 reviewed by me with L lung base scarring otherwise unremakrable  Pulmonary Functions Testing Results: PFT Results Latest Ref Rng & Units 05/22/2021  FVC-Pre L 3.96  FVC-Predicted Pre % 87  FVC-Post L 4.11  FVC-Predicted Post % 90  Pre FEV1/FVC % % 71  Post FEV1/FCV % % 72  FEV1-Pre L 2.82  FEV1-Predicted Pre % 85  FEV1-Post L 2.94  DLCO uncorrected ml/min/mmHg 25.55  DLCO UNC% % 96  DLVA Predicted % 102  TLC L 8.12  TLC % Predicted % 109  RV % Predicted % 155   +Air  trapping    Echocardiogram:   TTE 5/25 unremarkable     Assessment & Plan:   # Dyspnea on exertion: sounds suspicious for asthma and he does have air trapping on recent PFT (obstruction may have been masked by serevent use that day as well). If he has asthma then it sounds like GERD is likely playing significant role.   Plan: - declines LABA/ICS due to pneumonia when on dulera in past - continue anoro 1 puff once daily - omeprazole 40 mg daily every morning 30 minutes before eating - gerd lifestyle measures reinforced  RTC 1 year      Maryjane Hurter, MD [facility] 11/15/2021 12:49 PM

## 2021-11-14 ENCOUNTER — Ambulatory Visit: Payer: Medicare HMO | Admitting: Student

## 2021-11-15 ENCOUNTER — Other Ambulatory Visit: Payer: Self-pay

## 2021-11-15 ENCOUNTER — Ambulatory Visit: Payer: Medicare HMO | Admitting: Student

## 2021-11-15 ENCOUNTER — Encounter: Payer: Self-pay | Admitting: Student

## 2021-11-15 VITALS — BP 126/70 | HR 55 | Temp 97.4°F | Ht 72.0 in | Wt 174.4 lb

## 2021-11-15 DIAGNOSIS — R053 Chronic cough: Secondary | ICD-10-CM

## 2021-11-15 DIAGNOSIS — K219 Gastro-esophageal reflux disease without esophagitis: Secondary | ICD-10-CM

## 2021-11-15 DIAGNOSIS — J4530 Mild persistent asthma, uncomplicated: Secondary | ICD-10-CM

## 2021-11-15 MED ORDER — ANORO ELLIPTA 62.5-25 MCG/ACT IN AEPB: 1.0000 | INHALATION_SPRAY | Freq: Every day | RESPIRATORY_TRACT | 11 refills | Status: DC

## 2021-11-15 MED ORDER — OMEPRAZOLE 40 MG PO CPDR: DELAYED_RELEASE_CAPSULE | ORAL | 3 refills | Status: DC

## 2021-11-15 NOTE — Patient Instructions (Addendum)
- refilled omeprazole and anoro - See you in a year!  Gastroesophageal Reflux Disease, Adult Gastroesophageal reflux (GER) happens when acid from the stomach flows up into the tube that connects the mouth and the stomach (esophagus). Normally, food travels down the esophagus and stays in the stomach to be digested. However, when a person has GER, food and stomach acid sometimes move back up into the esophagus. If this becomes a more serious problem, the person may be diagnosed with a disease called gastroesophageal reflux disease (GERD). GERD occurs when the reflux: Happens often. Causes frequent or severe symptoms. Causes problems such as damage to the esophagus. When stomach acid comes in contact with the esophagus, the acid may cause inflammation in the esophagus. Over time, GERD may create small holes (ulcers) in the lining of the esophagus. What are the causes? This condition is caused by a problem with the muscle between the esophagus and the stomach (lower esophageal sphincter, or LES). Normally, the LES muscle closes after food passes through the esophagus to the stomach. When the LES is weakened or abnormal, it does not close properly, and that allows food and stomach acid to go back up into the esophagus. The LES can be weakened by certain dietary substances, medicines, and medical conditions, including: Tobacco use. Pregnancy. Having a hiatal hernia. Alcohol use. Certain foods and beverages, such as coffee, chocolate, onions, and peppermint. What increases the risk? You are more likely to develop this condition if you: Have an increased body weight. Have a connective tissue disorder. Take NSAIDs, such as ibuprofen. What are the signs or symptoms? Symptoms of this condition include: Heartburn. Difficult or painful swallowing and the feeling of having a lump in the throat. A bitter taste in the mouth. Bad breath and having a large amount of saliva. Having an upset or bloated  stomach and belching. Chest pain. Different conditions can cause chest pain. Make sure you see your health care provider if you experience chest pain. Shortness of breath or wheezing. Ongoing (chronic) cough or a nighttime cough. Wearing away of tooth enamel. Weight loss. How is this diagnosed? This condition may be diagnosed based on a medical history and a physical exam. To determine if you have mild or severe GERD, your health care provider may also monitor how you respond to treatment. You may also have tests, including: A test to examine your stomach and esophagus with a small camera (endoscopy). A test that measures the acidity level in your esophagus. A test that measures how much pressure is on your esophagus. A barium swallow or modified barium swallow test to show the shape, size, and functioning of your esophagus. How is this treated? Treatment for this condition may vary depending on how severe your symptoms are. Your health care provider may recommend: Changes to your diet. Medicine. Surgery. The goal of treatment is to help relieve your symptoms and to prevent complications. Follow these instructions at home: Eating and drinking  Follow a diet as recommended by your health care provider. This may involve avoiding foods and drinks such as: Coffee and tea, with or without caffeine. Drinks that contain alcohol. Energy drinks and sports drinks. Carbonated drinks or sodas. Chocolate and cocoa. Peppermint and mint flavorings. Garlic and onions. Horseradish. Spicy and acidic foods, including peppers, chili powder, curry powder, vinegar, hot sauces, and barbecue sauce. Citrus fruit juices and citrus fruits, such as oranges, lemons, and limes. Tomato-based foods, such as red sauce, chili, salsa, and pizza with red sauce. [place] and  fatty foods, such as donuts, french fries, potato chips, and high-fat dressings. High-fat meats, such as hot dogs and fatty cuts of red and white  meats, such as rib eye steak, sausage, ham, and bacon. High-fat dairy items, such as whole milk, butter, and cream cheese. Eat small, frequent meals instead of large meals. Avoid drinking large amounts of liquid with your meals. Avoid eating meals during the 2-3 hours before bedtime. Avoid lying down right after you eat. Do not exercise right after you eat. Lifestyle  Do not use any products that contain nicotine or tobacco. These products include cigarettes, chewing tobacco, and vaping devices, such as e-cigarettes. If you need help quitting, ask your health care provider. Try to reduce your stress by using methods such as yoga or meditation. If you need help reducing stress, ask your health care provider. If you are overweight, reduce your weight to an amount that is healthy for you. Ask your health care provider for guidance about a safe weight loss goal. General instructions Pay attention to any changes in your symptoms. Take over-the-counter and prescription medicines only as told by your health care provider. Do not take aspirin, ibuprofen, or other NSAIDs unless your health care provider told you to take these medicines. Wear loose-fitting clothing. Do not wear anything tight around your waist that causes pressure on your abdomen. Raise (elevate) the head of your bed about 6 inches (15 cm). You can use a wedge to do this. Avoid bending over if this makes your symptoms worse. Keep all follow-up visits. This is important. Contact a health care provider if: You have: New symptoms. Unexplained weight loss. Difficulty swallowing or it hurts to swallow. Wheezing or a persistent cough. A hoarse voice. Your symptoms do not improve with treatment. Get help right away if: You have sudden pain in your arms, neck, jaw, teeth, or back. You suddenly feel sweaty, dizzy, or light-headed. You have chest pain or shortness of breath. You vomit and the vomit is green, yellow, or black, or it looks  like blood or coffee grounds. You faint. You have stool that is red, bloody, or black. You cannot swallow, drink, or eat. These symptoms may represent a serious problem that is an emergency. Do not wait to see if the symptoms will go away. Get medical help right away. Call your local emergency services (911 in the [place] Do not drive yourself to the hospital. Summary Gastroesophageal reflux happens when acid from the stomach flows up into the esophagus. GERD is a disease in which the reflux happens often, causes frequent or severe symptoms, or causes problems such as damage to the esophagus. Treatment for this condition may vary depending on how severe your symptoms are. Your health care provider may recommend diet and lifestyle changes, medicine, or surgery. Contact a health care provider if you have new or worsening symptoms. Take over-the-counter and prescription medicines only as told by your health care provider. Do not take aspirin, ibuprofen, or other NSAIDs unless your health care provider told you to do so. Keep all follow-up visits as told by your health care provider. This is important. This information is not intended to replace advice given to you by your health care provider. Make sure you discuss any questions you have with your health care provider. Document Revised: 04/18/2020 Document Reviewed: 04/18/2020 Elsevier Patient Education  [place].

## 2021-12-10 ENCOUNTER — Encounter: Payer: Self-pay | Admitting: Student

## 2021-12-11 MED ORDER — SEREVENT DISKUS 50 MCG/ACT IN AEPB: 1.0000 | INHALATION_SPRAY | Freq: Two times a day (BID) | RESPIRATORY_TRACT | 12 refills | Status: DC

## 2022-01-02 ENCOUNTER — Encounter: Payer: Self-pay | Admitting: Cardiovascular Disease

## 2022-01-02 ENCOUNTER — Encounter: Payer: Self-pay | Admitting: Student

## 2022-01-02 ENCOUNTER — Other Ambulatory Visit: Payer: Self-pay | Admitting: Student

## 2022-01-02 DIAGNOSIS — J449 Chronic obstructive pulmonary disease, unspecified: Secondary | ICD-10-CM

## 2022-01-02 MED ORDER — ALBUTEROL SULFATE HFA 108 (90 BASE) MCG/ACT IN AERS: 1.0000 | INHALATION_SPRAY | Freq: Four times a day (QID) | RESPIRATORY_TRACT | 1 refills | Status: DC | PRN

## 2022-01-03 ENCOUNTER — Other Ambulatory Visit: Payer: Self-pay

## 2022-01-03 ENCOUNTER — Encounter: Payer: Self-pay | Admitting: Dermatology

## 2022-01-03 ENCOUNTER — Ambulatory Visit: Payer: Medicare HMO | Admitting: Dermatology

## 2022-01-03 DIAGNOSIS — L568 Other specified acute skin changes due to ultraviolet radiation: Secondary | ICD-10-CM

## 2022-01-03 DIAGNOSIS — L570 Actinic keratosis: Secondary | ICD-10-CM

## 2022-01-03 DIAGNOSIS — Z85828 Personal history of other malignant neoplasm of skin: Secondary | ICD-10-CM | POA: Diagnosis not present

## 2022-01-15 ENCOUNTER — Encounter: Payer: Self-pay | Admitting: Dermatology

## 2022-01-15 NOTE — Progress Notes (Signed)
? ?  Follow-Up Visit ?  ?Subjective  ?Walter Taylor is a 76 y.o. male who presents for the following: Skin Problem (Lesion on bottom lip x years. Personal history of scc and bcc. ). ? ?Persistent crusting on left and gentleman with history of nonmelanoma skin cancers ?Location:  ?Duration:  ?Quality:  ?Associated Signs/Symptoms: ?Modifying Factors:  ?Severity:  ?Timing: ?Context:  ? ?Objective  ?Well appearing patient in no apparent distress; mood and affect are within normal limits. ?Left Lower Vermilion Lip ?4 mm focus of slightly thicker pink crust but entire lower lip shows evidence of chronic UV damage. ? ? ? ?A focused examination was performed including head and neck. Relevant physical exam findings are noted in the Assessment and Plan. ? ? ?Assessment & Plan  ? ? ?Actinic cheilitis ?Left Lower Vermilion Lip ? ?Left lower lip treated with LN2 freeze from almost lateral commissure to just beyond midpoint, total 30 seconds with extra focus on the thicker lesion.  Well-tolerated. ? ?Destruction of lesion - Left Lower Vermilion Lip ?Complexity: simple   ?Destruction method: cryotherapy   ?Informed consent: discussed and consent obtained   ?Timeout:  patient name, date of birth, surgical site, and procedure verified ?Lesion destroyed using liquid nitrogen: Yes   ?Cryotherapy cycles:  30 ?Outcome: patient tolerated procedure well with no complications   ?Post-procedure details: wound care instructions given   ? ? ? ? ? ?I, Lavonna Monarch, MD, have reviewed all documentation for this visit.  The documentation on 01/15/22 for the exam, diagnosis, procedures, and orders are all accurate and complete. ?

## 2022-02-05 ENCOUNTER — Ambulatory Visit: Payer: Medicare HMO | Admitting: Cardiovascular Disease

## 2022-02-12 ENCOUNTER — Ambulatory Visit: Payer: Medicare HMO | Admitting: Dermatology

## 2022-03-06 ENCOUNTER — Encounter: Payer: Self-pay | Admitting: Dermatology

## 2022-03-06 ENCOUNTER — Ambulatory Visit: Payer: Medicare HMO | Admitting: Dermatology

## 2022-03-06 DIAGNOSIS — D485 Neoplasm of uncertain behavior of skin: Secondary | ICD-10-CM | POA: Diagnosis not present

## 2022-03-06 DIAGNOSIS — L570 Actinic keratosis: Secondary | ICD-10-CM | POA: Diagnosis not present

## 2022-03-06 NOTE — Patient Instructions (Signed)

## 2022-03-12 ENCOUNTER — Telehealth: Payer: Self-pay | Admitting: *Deleted

## 2022-03-12 NOTE — Telephone Encounter (Signed)
-----   Message from Lavonna Monarch, MD sent at 03/09/2022  5:10 PM EDT ----- This is a thick precancer that developed after a fairly aggressive freeze on that part of his lip.  The biopsy may take care of this lesion but he still needs a visit to freeze the other side of his lip.

## 2022-03-12 NOTE — Telephone Encounter (Signed)
Pathology to patient- office visit scheduled for LN2 per Dr Denna Haggard.

## 2022-03-21 NOTE — Progress Notes (Signed)
CARDIOLOGY CONSULT NOTE       Patient ID: Walter Taylor MRN: [id number] DOB/AGE: 02-02-1946 76 y.o.  Admit date: (Not on file) Referring Physician: Lelon Frohlich FNP  Primary Physician: Celene Squibb, MD Primary Cardiologist: Re establish last seen 2021  Reason for Consultation: Chest Pain / Murmur     HPI:  76 y.o. with hisotry of hypothyroidism, COPD and HLD. Seen by Dr Verl Blalock in 2010/2012 for palpitations and PAC;s cleared back then to have prostatectomy for cancer. 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 He walks and cycles a lot with no issues Has always tended toward bradycardia   He is concerned that his "murmur" has gotten louder and contributes to some afternoon fatigue  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   Negative ETT 03/15/21 read as equivocal HTN response TTE 03/15/21 trivial MR AV sclerosis and normal EF Calcium score never done    ROS All other systems reviewed and negative except as noted above  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   superficial right post shoulder (CX3, cuatery, 5FU)   BCC (basal cell carcinoma) 07/07/2013   left outer brow (CX3, 5FU)   BCC (basal cell carcinoma) 06/07/2014   right ear (CX3, 5FU)   BCC (basal cell carcinoma) 09/19/2016   nod, right side of neck (CX3, 5FU)   Cancer (HCC)    Prostate cancer   Emphysema  lung ([place])    GERD (gastroesophageal reflux disease)    occasionally   Heart murmur    Hyperlipidemia    Hypothyroidism    Osteoporosis    Palpitations    Hx of   SCC (squamous cell carcinoma) 06/07/2014   well diff, left temple (CX3, 5FU)   Shortness of breath dyspnea    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   V of neck (CX3, 5FU)    Family History  Problem Relation Age of Onset   Heart disease Father     Social History   Socioeconomic History   Marital status: Married    Spouse name: Not on file   Number of children: Not on file   Years of education: Not on file   Highest education level: Not on file  Occupational History   Occupation: Retired [employer] at [employer] department  Tobacco Use   Smoking status: Former    Packs/day: 1.00    Years: 15.00    Pack years: 15.00    Types: Cigarettes    Quit date: 10/22/1978    Years since quitting: 43.4   Smokeless tobacco: Never  Vaping Use   Vaping Use: Never used  Substance and Sexual Activity   Alcohol use: Yes    Alcohol/week: 4.0 standard drinks  Types: 4 Cans of beer per week    Comment: moderate per pt   Drug use: No   Sexual activity: Not on file  Other Topics Concern   Not on file  Social History Narrative   Married with 2 children   Exercises regularly - bikes and swims   Social Determinants of Health   Financial Resource Strain: Not on file  Food Insecurity: Not on file  Transportation Needs: Not on file  Physical Activity: Not on file  Stress: Not on file  Social Connections: Not on file  Intimate Partner Violence: Not on file    Past Surgical History:  Procedure Laterality Date   COLONOSCOPY     COLONOSCOPY N/A 01/06/2015   Procedure: COLONOSCOPY;  Surgeon: Rogene Houston, MD;  Location: [facility];  Service: Endoscopy;  Laterality: N/A;  24 - Dr. Laural Golden leaving around lunch for vactions   ELBOW SURGERY     Tennis elbow   ESOPHAGEAL DILATION N/A 05/02/2015   Procedure: ESOPHAGEAL  DILATION;  Surgeon: Rogene Houston, MD;  Location: [facility];  Service: Endoscopy;  Laterality: N/A;   ESOPHAGOGASTRODUODENOSCOPY N/A 05/02/2015   Procedure: ESOPHAGOGASTRODUODENOSCOPY (EGD);  Surgeon: Rogene Houston, MD;  Location: [facility];  Service: Endoscopy;  Laterality: N/A;  1150   ESOPHAGOGASTRODUODENOSCOPY (EGD) WITH ESOPHAGEAL DILATION  2007   FINGER SURGERY     Trigger finger release   Left shoulder reconstruction     PROSTATECTOMY     VASECTOMY        Current Outpatient Medications:    albuterol (VENTOLIN HFA) 108 (90 Base) MCG/ACT inhaler, Inhale 1-2 puffs into the lungs every 6 (six) hours as needed for wheezing or shortness of breath., Disp: 18 g, Rfl: 1   Cholecalciferol (VITAMIN D3) 5000 UNITS TABS, Take 5,000 Units by mouth daily., Disp: , Rfl:    ezetimibe (ZETIA) 10 MG tablet, Take 1 tablet (10 mg total) by mouth daily., Disp: 30 tablet, Rfl: 5   hydrocortisone 2.5 % cream, Apply topically 2 (two) times daily as needed (Rash). Apply to lips and back 1-2 times daily, Disp: 30 g, Rfl: 1   omeprazole (PRILOSEC) 40 MG capsule, TAKE 1 CAPSULE(40 MG) BY MOUTH DAILY, Disp: 90 capsule, Rfl: 3   salmeterol (SEREVENT DISKUS) 50 MCG/ACT diskus inhaler, Inhale 1 puff into the lungs 2 (two) times daily., Disp: 1 each, Rfl: 12   simvastatin (ZOCOR) 5 MG tablet, Take 5 mg by mouth at bedtime., Disp: , Rfl:    SYNTHROID 150 MCG tablet, 150 mg daily and then 175 mg one day a week., Disp: , Rfl: 0    Physical Exam: Blood pressure 124/80, pulse 68, height 6' (1.829 m), weight 168 lb 3.2 oz (76.3 kg), SpO2 97 %.    Affect appropriate Healthy:  appears stated age 25: normal Neck supple with no adenopathy JVP normal no bruits no thyromegaly Lungs clear with no wheezing and good diaphragmatic motion Heart:  S1/S2 SEM in aortic position and MR murmur at apex , no rub, gallop or click PMI normal Abdomen: benighn, BS positve, no tenderness, no AAA no bruit.  No HSM or  HJR Distal pulses intact with no bruits No edema Neuro non-focal Skin warm and dry No muscular weakness   Labs:   Lab Results  Component Value Date   WBC 4.9 06/29/2021   HGB 13.1 06/29/2021   HCT 38.8 (L) 06/29/2021   MCV 91.7 06/29/2021   PLT 200.0 06/29/2021   No results  for input(s): NA, K, CL, CO2, BUN, CREATININE, CALCIUM, PROT, BILITOT, ALKPHOS, ALT, AST, GLUCOSE in the last 168 hours.  Invalid input(s): LABALBU No results found for: CKTOTAL, CKMB, CKMBINDEX, TROPONINI No results found for: CHOL No results found for: HDL No results found for: LDLCALC No results found for: TRIG No results found for: CHOLHDL No results found for: LDLDIRECT    Radiology: No results found.   EKG: SR rate 58 normal 03/27/2022 NSR rate 68 normal    ASSESSMENT AND PLAN:   1. Hypothyroidism:  Continue replacement labs with primary  2. COPD:  Continue serevent and ventolin no active wheezing Sees [facility]  3. HLD:  On Zetia/Statin. Labs with primary intolerant to statins  4. Murmur:  AV sclerosis TTE 03/15/21 no significant valvular issues   5. Chest pain:  Atypical normal ETT 03/15/21 Discussed utility of calcium score   6. GERD:  playing a role in his asthma omeprazole and lifestyle changes   Calcium Score    F/U in a year   Signed: Jenkins Rouge 03/27/2022, 10:35 AM

## 2022-03-26 ENCOUNTER — Encounter: Payer: Self-pay | Admitting: Dermatology

## 2022-03-26 NOTE — Progress Notes (Signed)
   Follow-Up Visit   Subjective  Walter Taylor is a 76 y.o. male who presents for the following: Follow-up (F/u on lower lip- ln2 last office visit- 2 months ago).  Persistent scaling area with aggressive freeze left lower lip Location:  Duration:  Quality:  Associated Signs/Symptoms: Modifying Factors:  Severity:  Timing: Context:   Objective  Well appearing patient in no apparent distress; mood and affect are within normal limits. Left Lower Vermilion Lip Persistent peeling crust despite aggressive LN2 freeze, biopsy indicated     Right Lower Vermilion Lip Mild diffuse pink crust    A focused examination was performed including head and neck.. Relevant physical exam findings are noted in the Assessment and Plan.   Assessment & Plan    Neoplasm of uncertain behavior of skin Left Lower Vermilion Lip  Skin / nail biopsy Type of biopsy: tangential   Informed consent: discussed and consent obtained   Timeout: patient name, date of birth, surgical site, and procedure verified   Procedure prep:  Patient was prepped and draped in usual sterile fashion (Non sterile) Prep type:  Chlorhexidine Anesthesia: the lesion was anesthetized in a standard fashion   Anesthetic:  1% lidocaine w/ epinephrine 1-100,000 local infiltration Instrument used: flexible razor blade   Outcome: patient tolerated procedure well   Post-procedure details: wound care instructions given    Specimen 1 - Surgical pathology Differential Diagnosis: bcc vs scc  Check Margins: No  AK (actinic keratosis) Right Lower Vermilion Lip  Defer treatment until after biopsy result is known  Related Medications Fluorouracil (TOLAK) 4 % CREA Apply to affected area nightly x 28 applications.      I, Lavonna Monarch, MD, have reviewed all documentation for this visit.  The documentation on 03/26/22 for the exam, diagnosis, procedures, and orders are all accurate and complete.

## 2022-03-27 ENCOUNTER — Ambulatory Visit: Payer: Medicare HMO | Admitting: Cardiovascular Disease

## 2022-03-27 ENCOUNTER — Encounter: Payer: Self-pay | Admitting: Cardiovascular Disease

## 2022-03-27 VITALS — BP 124/80 | HR 68 | Ht 72.0 in | Wt 168.2 lb

## 2022-03-27 DIAGNOSIS — E782 Mixed hyperlipidemia: Secondary | ICD-10-CM | POA: Diagnosis not present

## 2022-03-27 DIAGNOSIS — I340 Nonrheumatic mitral (valve) insufficiency: Secondary | ICD-10-CM | POA: Diagnosis not present

## 2022-03-27 DIAGNOSIS — R079 Chest pain, unspecified: Secondary | ICD-10-CM

## 2022-03-27 NOTE — Patient Instructions (Signed)
Medication Instructions:  Your physician recommends that you continue on your current medications as directed. Please refer to the Current Medication list given to you today.  *If you need a refill on your cardiac medications before your next appointment, please call your pharmacy*   Lab Work: NONE   If you have labs (blood work) drawn today and your tests are completely normal, you will receive your results only by: [facility] (if you have MyChart) OR A paper copy in the mail If you have any lab test that is abnormal or we need to change your treatment, we will call you to review the results.   Testing/Procedures: Coronary Calcium Score    Follow-Up: At [facility], you and your health needs are our priority.  As part of our continuing mission to provide you with exceptional heart care, we have created designated Provider Care Teams.  These Care Teams include your primary Cardiologist (physician) and Advanced Practice Providers (APPs -  Physician Assistants and Nurse Practitioners) who all work together to provide you with the care you need, when you need it.  We recommend signing up for the patient portal called "MyChart".  Sign up information is provided on this After Visit Summary.  MyChart is used to connect with patients for Virtual Visits (Telemedicine).  Patients are able to view lab/test results, encounter notes, upcoming appointments, etc.  Non-urgent messages can be sent to your provider as well.   To learn more about what you can do with MyChart, go to [email or web address].    Your next appointment:   1 year(s)  The format for your next appointment:   In Person  Provider:   Jenkins Rouge, MD    Other Instructions Thank you for choosing [facility]!    Important Information About Sugar

## 2022-05-03 ENCOUNTER — Encounter: Payer: Self-pay | Admitting: Dermatology

## 2022-05-03 ENCOUNTER — Ambulatory Visit: Payer: Medicare HMO | Admitting: Dermatology

## 2022-05-03 DIAGNOSIS — L570 Actinic keratosis: Secondary | ICD-10-CM | POA: Diagnosis not present

## 2022-05-07 ENCOUNTER — Ambulatory Visit (HOSPITAL_COMMUNITY)
Admission: RE | Admit: 2022-05-07 | Discharge: 2022-05-07 | Disposition: A | Discharge status: Home / Self Care | Payer: Medicare HMO | Source: Ambulatory Visit | Attending: Cardiovascular Disease | Admitting: Cardiovascular Disease

## 2022-05-07 DIAGNOSIS — I340 Nonrheumatic mitral (valve) insufficiency: Secondary | ICD-10-CM | POA: Insufficient documentation

## 2022-05-08 ENCOUNTER — Telehealth: Payer: Self-pay

## 2022-05-08 NOTE — Telephone Encounter (Signed)
Patient notified and voiced understanding. Patient stated that he was not having any chest pain or discomfort. Pt had no further questions or concerns at this time.

## 2022-05-08 NOTE — Telephone Encounter (Signed)
-----   Message from Michaelyn Barter, RN sent at 05/08/2022 11:24 AM EDT -----  ----- Message ----- From: Josue Hector, MD Sent: 05/07/2022   4:15 PM EDT To: Michaelyn Barter, RN  Calcium score over 400 and above average for age. If he has any chest pain order Lexiscan myovue if not can observe

## 2022-05-26 ENCOUNTER — Encounter: Payer: Self-pay | Admitting: Dermatology

## 2022-05-26 NOTE — Progress Notes (Signed)
   Follow-Up Visit   Subjective  Walter Taylor is a 76 y.o. male who presents for the following: Follow-up (Right side of lower lip- LN2).  Multiple actinic keratoses lips Location:  Duration:  Quality:  Associated Signs/Symptoms: Modifying Factors:  Severity:  Timing: Context:   Objective  Well appearing patient in no apparent distress; mood and affect are within normal limits. Left Lower Vermilion Lip, Right Lower Vermilion Lip (8) Diffuse actinic keratosis 9at least 8 lesions) right lip, 1 persistent 2 mm hypertrophic crust left lower lip right lip 30 second ln2 and left 10 second     A focused examination was performed including head and neck.. Relevant physical exam findings are noted in the Assessment and Plan.   Assessment & Plan    AK (actinic keratosis) (9) Left Lower Vermilion Lip; Right Lower Vermilion Lip (8)  30 second freeze to the right lower lip to just beyond midline, 10-second freeze to the thick crust left lower lip which will need to be rebiopsied if it persists.  Destruction of lesion - Left Lower Vermilion Lip, Right Lower Vermilion Lip Complexity: simple   Destruction method: cryotherapy   Informed consent: discussed and consent obtained   Timeout:  patient name, date of birth, surgical site, and procedure verified Lesion destroyed using liquid nitrogen: Yes   Outcome: patient tolerated procedure well with no complications        I, Lavonna Monarch, MD, have reviewed all documentation for this visit.  The documentation on 05/26/22 for the exam, diagnosis, procedures, and orders are all accurate and complete.

## 2022-06-17 ENCOUNTER — Encounter: Payer: Self-pay | Admitting: Cardiovascular Disease

## 2022-06-17 DIAGNOSIS — E782 Mixed hyperlipidemia: Secondary | ICD-10-CM

## 2022-06-25 ENCOUNTER — Encounter (HOSPITAL_COMMUNITY): Payer: Self-pay | Admitting: Emergency Medicine

## 2022-06-25 ENCOUNTER — Emergency Department (HOSPITAL_COMMUNITY): Payer: Medicare HMO

## 2022-06-25 ENCOUNTER — Emergency Department (HOSPITAL_COMMUNITY)
Admission: EM | Admit: 2022-06-25 | Discharge: 2022-06-25 | Disposition: A | Discharge status: Home / Self Care | Payer: Medicare HMO | Attending: Emergency Medicine | Admitting: Emergency Medicine

## 2022-06-25 DIAGNOSIS — R011 Cardiac murmur, unspecified: Secondary | ICD-10-CM | POA: Insufficient documentation

## 2022-06-25 DIAGNOSIS — R072 Precordial pain: Secondary | ICD-10-CM | POA: Insufficient documentation

## 2022-06-25 DIAGNOSIS — R079 Chest pain, unspecified: Secondary | ICD-10-CM

## 2022-06-25 LAB — BASIC METABOLIC PANEL
Anion gap: 6 (ref 5–15)
BUN: 17 mg/dL (ref 8–23)
CO2: 28 mmol/L (ref 22–32)
Calcium: 9.6 mg/dL (ref 8.9–10.3)
Chloride: 105 mmol/L (ref 98–111)
Creatinine, Ser: 1.02 mg/dL (ref 0.61–1.24)
GFR, Estimated: >60 mL/min (ref >60)
Glucose, Bld: 105 mg/dL — ABNORMAL HIGH (ref 70–99)
Potassium: 4.1 mmol/L (ref 3.5–5.1)
Sodium: 139 mmol/L (ref 135–145)

## 2022-06-25 LAB — CBC
HCT: 41.3 % (ref 39.0–52.0)
Hemoglobin: 13.8 g/dL (ref 13.0–17.0)
MCH: 30.9 pg (ref 26.0–34.0)
MCHC: 33.4 g/dL (ref 30.0–36.0)
MCV: 92.4 fL (ref 80.0–100.0)
Platelets: 209 10*3/uL (ref 150–400)
RBC: 4.47 MIL/uL (ref 4.22–5.81)
RDW: 13.1 % (ref 11.5–15.5)
WBC: 4.3 10*3/uL (ref 4.0–10.5)
nRBC: 0 % (ref 0.0–0.2)

## 2022-06-25 LAB — TROPONIN I (HIGH SENSITIVITY)
Troponin I (High Sensitivity): 4 ng/L (ref <18)
Troponin I (High Sensitivity): 3 ng/L (ref <18)

## 2022-06-25 NOTE — ED Triage Notes (Signed)
Pt to the ED with deep aching left side chest pain that began this morning.  Pt states the pain does not radiate.

## 2022-06-25 NOTE — Discharge Instructions (Signed)
You were seen in the emergency department for some left-sided chest pain.  You had blood work EKG and a chest x-ray that did not show an obvious explanation for your symptoms.  Your heart enzymes did not show any evidence of heart attack.  Please contact your cardiology for close follow-up.  Return to the emergency department if any worsening or concerning symptoms

## 2022-06-25 NOTE — ED Provider Notes (Signed)
[facility] EMERGENCY DEPARTMENT Provider Note   CSN: [id number] Arrival date & time: 06/25/22  1631     History  Chief Complaint  Patient presents with   Chest Pain    Walter Taylor is a 76 y.o. male.  He is here with a complaint of some substernal chest pressure that is been going on since this morning.  He said he has basically had this for over a year comes and goes.  Today seem to last longer than usual so he wanted to get it checked out.  It does not seem to limit his activity and he actually went for a bike ride today.  He is seeing cardiology for this, has a known murmur that they are following and had a coronary CT recently.  Negative ETT 5/22.  Chest pain does not radiate it is not associated with any dizziness nausea shortness of breath diaphoresis or any other symptoms that he can identify.  Rates it as 5 out of 10.  The history is provided by the patient.  Chest Pain Pain location:  Substernal area and L chest Pain quality: aching   Pain radiates to:  Does not radiate Pain severity:  Moderate Onset quality:  Gradual Duration:  1 day Timing:  Constant Progression:  Unchanged Chronicity:  Recurrent Relieved by:  None tried Worsened by:  Nothing Ineffective treatments:  None tried Associated symptoms: no abdominal pain, no back pain, no cough, no diaphoresis, no fever, no nausea, no shortness of breath, no syncope and no vomiting   Risk factors: high cholesterol        Home Medications Prior to Admission medications   Medication Sig Start Date End Date Taking? Authorizing Provider  albuterol (VENTOLIN HFA) 108 (90 Base) MCG/ACT inhaler Inhale 1-2 puffs into the lungs every 6 (six) hours as needed for wheezing or shortness of breath. 01/02/22   Maryjane Hurter, MD  Cholecalciferol (VITAMIN D3) 5000 UNITS TABS Take 5,000 Units by mouth daily.    [provider]  ezetimibe (ZETIA) 10 MG tablet Take 1 tablet (10 mg total) by mouth daily. 02/22/20   Corum,  Rex Kras, MD  hydrocortisone 2.5 % cream Apply topically 2 (two) times daily as needed (Rash). Apply to lips and back 1-2 times daily 08/14/21   Lavonna Monarch, MD  omeprazole (PRILOSEC) 40 MG capsule TAKE 1 CAPSULE(40 MG) BY MOUTH DAILY 11/15/21   Maryjane Hurter, MD  salmeterol (SEREVENT DISKUS) 50 MCG/ACT diskus inhaler Inhale 1 puff into the lungs 2 (two) times daily. 12/11/21   Maryjane Hurter, MD  simvastatin (ZOCOR) 5 MG tablet Take 5 mg by mouth at bedtime. 03/29/20   [provider]  SYNTHROID 150 MCG tablet 150 mg daily and then 175 mg one day a week. 01/18/15   [provider]      Allergies    Erythromycin base, Erythromycin, Nsaids, Other, Pollen extract, and Statins    Review of Systems   Review of Systems  Constitutional:  Negative for diaphoresis and fever.  Eyes:  Negative for visual disturbance.  Respiratory:  Negative for cough and shortness of breath.   Cardiovascular:  Positive for chest pain. Negative for syncope.  Gastrointestinal:  Negative for abdominal pain, nausea and vomiting.  Musculoskeletal:  Negative for back pain.    Physical Exam Updated Vital Signs BP (!) 156/103 (BP Location: Right Arm)   Pulse 65   Temp 98.3 F (36.8 C) (Oral)   Resp 16   Ht 6' (  1.829 m)   Wt 76.2 kg   SpO2 100%   BMI 22.78 kg/m  Physical Exam Vitals and nursing note reviewed.  Constitutional:      General: He is not in acute distress.    Appearance: Normal appearance. He is well-developed.  HENT:     Head: Normocephalic and atraumatic.  Eyes:     Conjunctiva/sclera: Conjunctivae normal.  Cardiovascular:     Rate and Rhythm: Normal rate and regular rhythm.     Heart sounds: Murmur heard.     Systolic murmur is present.  Pulmonary:     Effort: Pulmonary effort is normal. No respiratory distress.     Breath sounds: Normal breath sounds.  Abdominal:     Palpations: Abdomen is soft.     Tenderness: There is no abdominal tenderness.  Musculoskeletal:         General: No swelling. Normal range of motion.     Cervical back: Neck supple.     Right lower leg: No tenderness. No edema.     Left lower leg: No tenderness. No edema.  Skin:    General: Skin is warm and dry.     Capillary Refill: Capillary refill takes less than 2 seconds.  Neurological:     General: No focal deficit present.     Mental Status: He is alert.     ED Results / Procedures / Treatments   Labs (all labs ordered are listed, but only abnormal results are displayed) Labs Reviewed  BASIC METABOLIC PANEL - Abnormal; Notable for the following components:      Result Value   Glucose, Bld 105 (*)    All other components within normal limits  CBC  TROPONIN I (HIGH SENSITIVITY)  TROPONIN I (HIGH SENSITIVITY)    EKG EKG Interpretation  Date/Time:  Monday June 25 2022 16:40:30 EDT Ventricular Rate:  64 PR Interval:  162 QRS Duration: 98 QT Interval:  396 QTC Calculation: 408 R Axis:   32 Text Interpretation: Normal sinus rhythm with sinus arrhythmia Incomplete right bundle branch block Borderline ECG No previous ECGs available Confirmed by Aletta Edouard [phone]) on 06/25/2022 4:42:50 PM  Radiology DG Chest 2 View  Result Date: 06/25/2022 CLINICAL DATA:  Left-sided chest pain beginning this morning. EXAM: CHEST - 2 VIEW COMPARISON:  06/29/2021 FINDINGS: The heart size and mediastinal contours are within normal limits. Mild scarring again seen in the left lung base. Both lungs are otherwise clear. The visualized skeletal structures are unremarkable. IMPRESSION: No active cardiopulmonary disease. Electronically Signed   By: Marlaine Hind M.D.   On: 06/25/2022 17:03    Procedures Procedures    Medications Ordered in ED Medications - No data to display  ED Course/ Medical Decision Making/ A&P Clinical Course as of 06/26/22 1116  Mon Jun 25, 2022  1922 Patient's troponins are negative x2.  Rest of his work-up has been unremarkable.  I reviewed this with him  and he is comfortable for discharge and outpatient follow-up with his cardiology team. [MB]    Clinical Course User Index [MB] Hayden Rasmussen, MD                           Medical Decision Making Amount and/or Complexity of Data Reviewed Labs: ordered. Radiology: ordered.   This patient complains of left-sided chest discomfort; this involves an extensive number of treatment Options and is a complaint that carries with it a high risk of complications and morbidity. The  differential includes ACS, pneumonia, PE, pneumothorax, musculoskeletal, costochondritis, reflux  I ordered, reviewed and interpreted labs, which included CBC normal, chemistries normal, troponins flat  I ordered imaging studies which included chest x-ray and I independently    visualized and interpreted imaging which showed no acute findings Additional history obtained from patient's wife Previous records obtained and reviewed in epic including prior cardiology visits  Cardiac monitoring reviewed, normal sinus rhythm Social determinants considered, no significant barriers Critical Interventions: None  After the interventions stated above, I reevaluated the patient and found patient's discomfort to be improved Admission and further testing considered, no indications for admission at this time.  He said he is intolerant to aspirin so we will hold off on that.  Recommended close follow-up with cardiology.  Return instructions discussed.         Final Clinical Impression(s) / ED Diagnoses Final diagnoses:  Nonspecific chest pain    Rx / DC Orders ED Discharge Orders     None         Hayden Rasmussen, MD 06/26/22 [phone]

## 2022-08-01 ENCOUNTER — Ambulatory Visit: Payer: Medicare HMO | Attending: Cardiology | Admitting: Pharmacist

## 2022-08-01 ENCOUNTER — Encounter: Payer: Self-pay | Admitting: Pharmacist

## 2022-08-01 ENCOUNTER — Telehealth: Payer: Self-pay | Admitting: Pharmacist

## 2022-08-01 DIAGNOSIS — R079 Chest pain, unspecified: Secondary | ICD-10-CM

## 2022-08-01 DIAGNOSIS — E785 Hyperlipidemia, unspecified: Secondary | ICD-10-CM

## 2022-08-01 DIAGNOSIS — R931 Abnormal findings on diagnostic imaging of heart and coronary circulation: Secondary | ICD-10-CM

## 2022-08-01 NOTE — Patient Instructions (Signed)
It was nice meeting you today  We would like your LDL to be less than 70  Please continue your simvastatin and Zetia for the time being  We would like to start a new medication called Repatha or Praluent, both of which are used once every 2 weeks  I will complete the prior authorization for you and contact you when it is approved  Once you start the medication we will recheck your cholesterol in 2-3 months  Please let us know if you have any questions  Karren Cobble, PharmD, BCACP, [place], [place] [address] [place], [place] [address] Phone: [phone]; Fax: [phone] 08/01/2022 1:50 PM

## 2022-08-01 NOTE — Progress Notes (Signed)
Patient ID: JERRALD DOVERSPIKE                 DOB: 1946/02/07                    MRN: [id number]     HPI: Walter Taylor is a 76 y.o. male patient referred to lipid clinic by Dr Johnsie Cancel. PMH is significant for pre DM (A1c 6.0), hypothyroidism, HLD, elevated coronary calcium score, and statin intolerance. Seen in ED on 9/4 for chest pain which resolved on its own.  Patient presents today in good spirits. Had begun looking up other lipid lowering therapies and asked about addition of Nexletol. Previously had adverse effects to multiple statins but does not remember the names. IS able to tolerate simvastatin '5mg'$ . Has also been on Zetia '10mg'$  for about 20 years he reports.  Tries his best to be physically active. Rides his bike and is trying to increase. Does not drink alcohol. Reports he has gained weight recently but it does not show that in our records.   Current Medications:  Zetia '10mg'$  daily Simvastatin '5mg'$  daily  Intolerances:  Statins  Risk Factors:  CAD Elevated coronary calcium score  LDL goal: <70  Labs: TC 191, LDL 94, HDL 86, Trigs 59 (06/14/22)  Coronary calcium score of 417. This was 57th percentile for age-, race-, and sex-matched controls.  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 18 g 1   Cholecalciferol (VITAMIN D3) 5000 UNITS TABS Take 5,000 Units by mouth daily.     ezetimibe (ZETIA) 10 MG tablet Take 1 tablet (10 mg total) by mouth daily. 30 tablet 5   omeprazole (PRILOSEC) 40 MG capsule TAKE 1 CAPSULE(40 MG) BY MOUTH DAILY 90 capsule 3   salmeterol (SEREVENT DISKUS) 50 MCG/ACT diskus inhaler Inhale 1 puff into the lungs 2 (two) times daily. 1 each 12   simvastatin (ZOCOR) 5 MG tablet Take 5 mg by mouth at bedtime.     SYNTHROID 150 MCG tablet 150 mg daily and then 175 mg one day a week.  0   No current facility-administered medications on file prior to visit.    Allergies  Allergen Reactions   Erythromycin Base Other (See Comments)   Erythromycin Other (See Comments)    Liver pain   Nsaids Other (See Comments)  Liver pain   Other Swelling   Pollen Extract Swelling   Statins Other (See Comments)    Liver pain    Assessment/Plan:  1. Hyperlipidemia - Patient LDL 94 which is above goal of <70. Discussed lipid lowering options with patient including Nexletol and PCSK9i. Due to patient coronary calcium recommended starting PCSK9i over Nexletol and patient is agreeable.  Using demo pen, educated patient on mechanism of action, storage, site selection, administration, and possible adverse effects. Patient voiced understanding and was able to demonstrate in room. Will complete PA and contact patient over myChart when approved. Recheck lipid panel in 2-3 months. May be able to d/c oral lipid lowering medications at that time.  Continue Zetia '10mg'$  daily Continue simvastatin '5mg'$  daily Start Repatha/Praluent q 2 weeks Recheck lipid panel in 2-3 months  Karren Cobble, PharmD, BCACP,  [place], [place] [address] [place], [place] [address] Phone: [phone]; Fax: [phone] 08/01/2022 4:37 PM

## 2022-08-01 NOTE — Telephone Encounter (Signed)
PA for Repatha submitted. Key: [id number]

## 2022-08-02 MED ORDER — PRALUENT 75 MG/ML ~~LOC~~ SOAJ: 1.0000 mL | SUBCUTANEOUS | 3 refills | Status: DC

## 2022-08-02 NOTE — Addendum Note (Signed)
Addended by: Rollen Sox on: 08/02/2022 08:39 AM   Modules accepted: Orders

## 2022-08-02 NOTE — Telephone Encounter (Signed)
Plan prefers Praluent. Resubmittig. Key: Walter Taylor

## 2022-08-02 NOTE — Telephone Encounter (Signed)
Praluent PA approved through 10/21/22. Rx sent to pharmacy.

## 2022-09-19 ENCOUNTER — Other Ambulatory Visit (HOSPITAL_COMMUNITY): Payer: Self-pay | Admitting: Family Medicine

## 2022-09-19 DIAGNOSIS — R413 Other amnesia: Secondary | ICD-10-CM

## 2022-10-09 ENCOUNTER — Ambulatory Visit (HOSPITAL_COMMUNITY)
Admission: RE | Admit: 2022-10-09 | Discharge: 2022-10-09 | Disposition: A | Discharge status: Home / Self Care | Payer: Medicare HMO | Source: Ambulatory Visit | Attending: Family Medicine | Admitting: Family Medicine

## 2022-10-09 DIAGNOSIS — R413 Other amnesia: Secondary | ICD-10-CM | POA: Insufficient documentation

## 2022-10-09 MED ORDER — IOHEXOL 300 MG/ML  SOLN
75.0000 mL | Freq: Once | INTRAMUSCULAR | Status: AC | PRN
  Administered 2022-10-09: 75 mL via INTRAVENOUS

## 2022-10-22 ENCOUNTER — Other Ambulatory Visit: Payer: Self-pay | Admitting: Pharmacist

## 2022-10-22 ENCOUNTER — Encounter: Payer: Self-pay | Admitting: Pharmacist

## 2022-10-22 DIAGNOSIS — R931 Abnormal findings on diagnostic imaging of heart and coronary circulation: Secondary | ICD-10-CM

## 2022-10-22 DIAGNOSIS — E785 Hyperlipidemia, unspecified: Secondary | ICD-10-CM

## 2022-10-22 DIAGNOSIS — R079 Chest pain, unspecified: Secondary | ICD-10-CM

## 2022-10-30 ENCOUNTER — Other Ambulatory Visit (HOSPITAL_COMMUNITY): Payer: Self-pay

## 2022-10-30 ENCOUNTER — Telehealth: Payer: Self-pay

## 2022-10-30 DIAGNOSIS — R931 Abnormal findings on diagnostic imaging of heart and coronary circulation: Secondary | ICD-10-CM

## 2022-10-30 DIAGNOSIS — R079 Chest pain, unspecified: Secondary | ICD-10-CM

## 2022-10-30 DIAGNOSIS — E785 Hyperlipidemia, unspecified: Secondary | ICD-10-CM

## 2022-10-30 NOTE — Telephone Encounter (Signed)
Pharmacy Patient Advocate Encounter   Received notification from [facility] that prior authorization for Praluent '75mg'$ /ml is required/requested.    PA submitted on 1.9.24 to (ins) [employer] via CoverMyMeds Key [id number]  Status is pending

## 2022-10-31 ENCOUNTER — Other Ambulatory Visit (HOSPITAL_COMMUNITY): Payer: Self-pay

## 2022-11-01 ENCOUNTER — Other Ambulatory Visit (HOSPITAL_COMMUNITY): Payer: Self-pay

## 2022-11-01 MED ORDER — REPATHA SURECLICK 140 MG/ML ~~LOC~~ SOAJ: 1.0000 mL | SUBCUTANEOUS | 3 refills | Status: DC

## 2022-11-01 NOTE — Telephone Encounter (Signed)
Pharmacy Patient Advocate Encounter  Received notification from [facility] that the request for prior authorization for Praluent '75MG'$ /ML has been denied due to The medication not being on the patients formulary.  However,    I have ran a test claim for Repatha '140mg'$  sure[employer] and it is approved with authorization up until 10/22/23. Please send in a current rx for this medication if appropriate and I'll update the spreadsheet.

## 2022-11-02 ENCOUNTER — Other Ambulatory Visit (HOSPITAL_COMMUNITY): Payer: Medicare HMO

## 2022-11-07 ENCOUNTER — Other Ambulatory Visit (HOSPITAL_COMMUNITY)
Admission: RE | Admit: 2022-11-07 | Discharge: 2022-11-07 | Disposition: A | Discharge status: Home / Self Care | Payer: Medicare HMO | Source: Ambulatory Visit | Attending: Cardiovascular Disease | Admitting: Cardiovascular Disease

## 2022-11-07 DIAGNOSIS — E785 Hyperlipidemia, unspecified: Secondary | ICD-10-CM | POA: Insufficient documentation

## 2022-11-07 DIAGNOSIS — R931 Abnormal findings on diagnostic imaging of heart and coronary circulation: Secondary | ICD-10-CM | POA: Diagnosis not present

## 2022-11-07 DIAGNOSIS — R079 Chest pain, unspecified: Secondary | ICD-10-CM | POA: Diagnosis not present

## 2022-11-07 LAB — LIPID PANEL
Cholesterol: 211 mg/dL — ABNORMAL HIGH (ref 0–200)
HDL: 98 mg/dL (ref >40)
LDL Cholesterol: 104 mg/dL — ABNORMAL HIGH (ref 0–99)
Total CHOL/HDL Ratio: 2.2 RATIO
Triglycerides: 45 mg/dL (ref <150)
VLDL: 9 mg/dL (ref 0–40)

## 2022-11-08 ENCOUNTER — Encounter: Payer: Self-pay | Admitting: Pharmacist

## 2022-11-08 DIAGNOSIS — R931 Abnormal findings on diagnostic imaging of heart and coronary circulation: Secondary | ICD-10-CM

## 2022-11-08 DIAGNOSIS — E782 Mixed hyperlipidemia: Secondary | ICD-10-CM

## 2022-11-08 DIAGNOSIS — E785 Hyperlipidemia, unspecified: Secondary | ICD-10-CM

## 2022-11-09 MED ORDER — PRALUENT 150 MG/ML ~~LOC~~ SOAJ: 150.0000 mg | SUBCUTANEOUS | 11 refills | Status: DC

## 2022-11-09 NOTE — Addendum Note (Signed)
Addended by: Rollen Sox on: 11/09/2022 05:17 PM   Modules accepted: Orders

## 2022-11-26 ENCOUNTER — Encounter: Payer: Self-pay | Admitting: Pharmacist

## 2022-11-26 DIAGNOSIS — R931 Abnormal findings on diagnostic imaging of heart and coronary circulation: Secondary | ICD-10-CM

## 2022-11-26 DIAGNOSIS — R079 Chest pain, unspecified: Secondary | ICD-10-CM

## 2022-11-26 DIAGNOSIS — E785 Hyperlipidemia, unspecified: Secondary | ICD-10-CM

## 2022-11-29 MED ORDER — REPATHA SURECLICK 140 MG/ML ~~LOC~~ SOAJ: 1.0000 mL | SUBCUTANEOUS | 3 refills | Status: DC

## 2022-11-29 NOTE — Addendum Note (Signed)
Addended by: Rollen Sox on: 11/29/2022 07:46 AM   Modules accepted: Orders

## 2022-12-06 NOTE — Progress Notes (Signed)
Synopsis: Referred for asthma by Celene Squibb, MD  Subjective:   PATIENT ID: Camila Li GENDER: male DOB: 1946-04-11, MRN: [id number]  Chief Complaint  Patient presents with   Follow-up    Doing well.  One year f/u   77yM with history of asthma - never had it as a kid he says though, smoking 1 ppd 15-18 years quit 72 ya, GERD who is referred for mild intermittent asthma.  He says that he's getting more short of breath with cycling. He has more difficulty keeping up with his group rides over the last year, especially over last 6 months. He has a little cough but more bothered by dyspnea. No accompanying throat tightness. He has had some worsening heartburn recently and takes tums. Has seasonal sinonasal congestion which is active.   Had an echo this past summer. Low risk stress this past summer as well.   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 Born in [facility] but has never lived outside of [place] otherwise.   Interval HPI: Continued anoro, omeprazole last visit, has decreased to prn use of ppi after making dietary changes. No longer uses anoro actually. Using serevent only. Couldn't tolerate LAMA.   Doing well overall since last visit.   Reaction to his cats over the summer and needed depo medrol shot. No longer has cat.   No sinus congestion, postnasal drainage.   Very infrequent use of albuterol.   Otherwise pertinent review of systems is negative.  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   superficial right post shoulder (CX3, cuatery, 5FU)   BCC (basal cell carcinoma) 07/07/2013   left outer brow (CX3, 5FU)   BCC (basal cell carcinoma) 06/07/2014   right ear (CX3, 5FU)   BCC (basal cell carcinoma) 09/19/2016   nod, right side of neck (CX3, 5FU)   Cancer (HCC)    Prostate cancer   Emphysema lung (HCC)     GERD (gastroesophageal reflux disease)    occasionally   Heart murmur    Hyperlipidemia    Hypothyroidism    Osteoporosis    Palpitations    Hx of   SCC (squamous cell carcinoma) 06/07/2014   well diff, left temple (CX3, 5FU)   Shortness of breath dyspnea    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 Laural Golden leaving around lunch for vactions   ELBOW SURGERY     Tennis elbow   ESOPHAGEAL DILATION N/A 05/02/2015   Procedure: ESOPHAGEAL DILATION;  Surgeon: Rogene Houston, MD;  Location: [facility];  Service: Endoscopy;  Laterality: N/A;   ESOPHAGOGASTRODUODENOSCOPY N/A 05/02/2015   Procedure: ESOPHAGOGASTRODUODENOSCOPY (EGD);  Surgeon: Rogene Houston, MD;  Location: [facility];  Service: Endoscopy;  Laterality: N/A;  1150   ESOPHAGOGASTRODUODENOSCOPY (EGD) WITH ESOPHAGEAL DILATION  2007   FINGER SURGERY     Trigger finger release   Left shoulder  reconstruction     PROSTATECTOMY     VASECTOMY      Social History   Socioeconomic History   Marital status: Married    Spouse name: Not on file   Number of children: Not on file   Years of education: Not on file   Highest education level: Not on file  Occupational History   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  Tobacco Use   Smoking status: Former    Packs/day: 1.00    Years: 15.00    Total pack years: 15.00    Types: Cigarettes    Quit date: 10/22/1978    Years since quitting: 44.1   Smokeless tobacco: Never  Vaping Use   Vaping Use: Never used  Substance and Sexual Activity   Alcohol use: Yes    Alcohol/week: 4.0 standard drinks of alcohol    Types: 4 Cans of beer per  week    Comment: moderate per pt   Drug use: No   Sexual activity: Not on file  Other Topics Concern   Not on file  Social History Narrative   Married with 2 children   Exercises regularly - bikes and swims   Social Determinants of Health   Financial Resource Strain: Not on file  Food Insecurity: Not on file  Transportation Needs: Not on file  Physical Activity: Not on file  Stress: Not on file  Social Connections: Not on file  Intimate Partner Violence: Not on file     Allergies  Allergen Reactions   Erythromycin Base Other (See Comments)   Erythromycin Other (See Comments)    Liver pain   Nsaids Other (See Comments)    Liver pain   Other Swelling   Pollen Extract Swelling   Statins Other (See Comments)    Liver pain     Outpatient Medications Prior to Visit  Medication Sig Dispense Refill   albuterol (VENTOLIN HFA) 108 (90 Base) MCG/ACT inhaler Inhale 1-2 puffs into the lungs every 6 (six) hours as needed for wheezing or shortness of breath. 18 g 1   Cholecalciferol (VITAMIN D3) 5000 UNITS TABS Take 5,000 Units by mouth daily.     Evolocumab (REPATHA SURECLICK) [id number] MG/ML SOAJ Inject 140 mg into the skin every 14 (fourteen) days. 6 mL 3   ezetimibe (ZETIA) 10 MG tablet Take 1 tablet (10 mg total) by mouth daily. 30 tablet 5   omeprazole (PRILOSEC) 40 MG capsule TAKE 1 CAPSULE(40 MG) BY MOUTH DAILY 90 capsule 3   salmeterol (SEREVENT DISKUS) 50 MCG/ACT diskus inhaler Inhale 1 puff into the lungs 2 (two) times daily. 1 each 12   simvastatin (ZOCOR) 5 MG tablet Take 5 mg by mouth at bedtime.     SYNTHROID 150 MCG tablet 150 mg daily and then 175 mg one day a week.  0   No facility-administered medications prior to visit.       Objective:   Physical Exam:  General appearance: 77 y.o., male, NAD, conversant, male, NAD, conversant  Eyes: anicteric sclerae; PERRL, tracking appropriately HENT: NCAT; MMM Neck: Trachea midline; no lymphadenopathy, no JVD Lungs: CTAB, no crackles, no  wheeze, with normal respiratory effort CV: RRR, +systolic murmur without radiation Abdomen: Soft, non-tender; non-distended, BS present  Extremities: No peripheral edema, warm Skin: Normal turgor and texture; no rash Psych: Appropriate affect Neuro: Alert and oriented to person and place, no focal deficit     Vitals:   12/07/22 1100  BP: 122/64  Pulse: 64  Temp: 97.8 F (36.6 C)  TempSrc: Oral  SpO2: 99%  Weight: 166 lb 6.4 oz (75.5 kg)  Height: 6' (1.829 m)     99% on RA BMI Readings from Last 3 Encounters:  12/07/22 22.57 kg/m  06/25/22 22.78 kg/m  03/27/22 22.81 kg/m   Wt Readings from Last 3 Encounters:  12/07/22 166 lb 6.4 oz (75.5 kg)  06/25/22 168 lb (76.2 kg)  03/27/22 168 lb 3.2 oz (76.3 kg)     CBC    Component Value Date/Time   WBC 4.3 06/25/2022 1702   RBC 4.47 06/25/2022 1702   HGB 13.8 06/25/2022 1702   HCT 41.3 06/25/2022 1702   PLT 209 06/25/2022 1702   MCV 92.4 06/25/2022 1702   MCH 30.9 06/25/2022 1702   MCHC 33.4 06/25/2022 1702   RDW 13.1 06/25/2022 1702   LYMPHSABS 2.1 06/29/2021 1642   MONOABS 0.5 06/29/2021 1642   EOSABS 0.1 06/29/2021 1642   BASOSABS 0.0 06/29/2021 1642      Chest Imaging: CXR 03/22/20 reviewed by me with L lung base scarring otherwise unremakrable  CT Cardiac scoring 05/07/22 with a few cysts and a few small foci of bilateral lower lobe scar  Pulmonary Functions Testing Results:    Latest Ref Rng & Units 05/22/2021    9:50 AM  PFT Results  FVC-Pre L 3.96   FVC-Predicted Pre % 87   FVC-Post L 4.11   FVC-Predicted Post % 90   Pre FEV1/FVC % % 71   Post FEV1/FCV % % 72   FEV1-Pre L 2.82   FEV1-Predicted Pre % 85   FEV1-Post L 2.94   DLCO uncorrected ml/min/mmHg 25.55   DLCO UNC% % 96   DLVA Predicted % 102   TLC L 8.12   TLC % Predicted % 109   RV % Predicted % 155    +Air trapping    Echocardiogram:   TTE 5/25 unremarkable     Assessment & Plan:   # Dyspnea on exertion: sounds suspicious  for asthma and he does have air trapping on recent PFT (obstruction may have been masked by serevent use that day as well). If he has asthma then it sounds like GERD has played significant role. Doing well overall today at this visit  Plan: - declines LABA/ICS due to pneumonia when on dulera in past, intolerant per pt of LAMA, continue serevent for now - will let us know if he's alternatively interested in trying singulair down the road - gerd lifestyle measures reinforced, ppi prn  RTC 1 year      Maryjane Hurter, MD [facility] 12/07/2022 12:30 PM

## 2022-12-07 ENCOUNTER — Ambulatory Visit: Payer: Medicare HMO | Admitting: Student

## 2022-12-07 ENCOUNTER — Encounter: Payer: Self-pay | Admitting: Student

## 2022-12-07 VITALS — BP 122/64 | HR 64 | Temp 97.8°F | Ht 72.0 in | Wt 166.4 lb

## 2022-12-07 DIAGNOSIS — J4530 Mild persistent asthma, uncomplicated: Secondary | ICD-10-CM | POA: Diagnosis not present

## 2022-12-07 NOTE — Patient Instructions (Addendum)
-   serevent 1 puff twice daily  - let me know if you'd ever like to try singulair - keep up the good work with eliminating reflux causing foods - see you in a year or sooner if need be!

## 2023-01-04 ENCOUNTER — Other Ambulatory Visit: Payer: Self-pay

## 2023-01-04 ENCOUNTER — Encounter: Payer: Self-pay | Admitting: Student

## 2023-01-04 MED ORDER — SEREVENT DISKUS 50 MCG/ACT IN AEPB: 1.0000 | INHALATION_SPRAY | Freq: Two times a day (BID) | RESPIRATORY_TRACT | 12 refills | Status: AC

## 2023-01-08 ENCOUNTER — Other Ambulatory Visit (HOSPITAL_COMMUNITY): Payer: Self-pay | Admitting: Nurse Practitioner

## 2023-01-08 DIAGNOSIS — Z8546 Personal history of malignant neoplasm of prostate: Secondary | ICD-10-CM

## 2023-01-09 ENCOUNTER — Other Ambulatory Visit (HOSPITAL_COMMUNITY): Payer: Self-pay | Admitting: Nurse Practitioner

## 2023-01-09 DIAGNOSIS — R634 Abnormal weight loss: Secondary | ICD-10-CM

## 2023-02-12 ENCOUNTER — Other Ambulatory Visit: Payer: Self-pay | Admitting: Student

## 2023-02-12 DIAGNOSIS — J449 Chronic obstructive pulmonary disease, unspecified: Secondary | ICD-10-CM

## 2023-02-27 ENCOUNTER — Other Ambulatory Visit: Payer: Self-pay

## 2023-02-27 ENCOUNTER — Encounter: Payer: Self-pay | Admitting: Student

## 2023-02-27 DIAGNOSIS — J449 Chronic obstructive pulmonary disease, unspecified: Secondary | ICD-10-CM

## 2023-02-27 MED ORDER — ALBUTEROL SULFATE HFA 108 (90 BASE) MCG/ACT IN AERS: INHALATION_SPRAY | RESPIRATORY_TRACT | 3 refills | Status: AC

## 2023-03-01 ENCOUNTER — Encounter: Payer: Self-pay | Admitting: Cardiovascular Disease

## 2023-03-05 ENCOUNTER — Encounter: Payer: Self-pay | Admitting: Pharmacist

## 2023-03-06 ENCOUNTER — Other Ambulatory Visit: Payer: Self-pay | Admitting: Student

## 2023-03-07 ENCOUNTER — Other Ambulatory Visit (HOSPITAL_COMMUNITY)
Admission: RE | Admit: 2023-03-07 | Discharge: 2023-03-07 | Disposition: A | Discharge status: Home / Self Care | Payer: Medicare HMO | Source: Ambulatory Visit | Attending: Cardiovascular Disease | Admitting: Cardiovascular Disease

## 2023-03-07 ENCOUNTER — Encounter: Payer: Self-pay | Admitting: Pharmacist

## 2023-03-07 DIAGNOSIS — E785 Hyperlipidemia, unspecified: Secondary | ICD-10-CM | POA: Insufficient documentation

## 2023-03-07 LAB — LIPID PANEL
Cholesterol: 160 mg/dL (ref 0–200)
HDL: 91 mg/dL (ref >40)
LDL Cholesterol: 55 mg/dL (ref 0–99)
Total CHOL/HDL Ratio: 1.8 RATIO
Triglycerides: 68 mg/dL (ref <150)
VLDL: 14 mg/dL (ref 0–40)

## 2023-04-01 NOTE — Progress Notes (Signed)
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 On repatha.  Patient comes in for yearly f/u. Has chest wall pain, like costochondritis. He feels it if he does light weights or bands or takes a deep breath when riding. Happens at rest or when he lays down. Also has reflux and doesn't like to take PPI's. Vague feeling. Has been going on for a couple of years. It improves quickly and he can reproduce it by touching.  Rides 20-25 miles once a week. No other cardio. 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 Swallow whole.   Cholecalciferol (VITAMIN D3) 5000 UNITS TABS Take 5,000 Units by mouth daily.   Evolocumab (REPATHA SURECLICK) 140 MG/ML SOAJ Inject 140 mg into the skin every 14 (fourteen) days.   omeprazole (PRILOSEC) 40 MG capsule TAKE 1 CAPSULE(40 MG) BY MOUTH DAILY   salmeterol (SEREVENT DISKUS) 50 MCG/ACT diskus inhaler Inhale 1 puff into the lungs 2 (two) times daily.   SYNTHROID 150 MCG tablet 150 mg daily and then 175 mg one day a week.    Allergies:   Erythromycin base, Erythromycin, Nsaids, Other, Pollen extract, and Statins   Social History   Tobacco Use   Smoking status: Former    Packs/day: 1.00    Years: 15.00    Additional pack years: 0.00    Total pack years: 15.00    Types: Cigarettes    Quit date: 10/22/1978    Years since quitting: 44.4   Smokeless tobacco:  Never  Vaping Use   Vaping Use: Never used  Substance Use Topics   Alcohol use: Yes    Alcohol/week: 4.0 standard drinks of alcohol    Types: 4 Cans of beer per week    Comment: moderate per pt   Drug use: No    Family Hx: The patient's family history includes Heart disease in his father.  ROS     Physical Exam:    VS:  BP 122/60   Pulse 68   Ht 6' (1.829 m)   Wt 165 lb 12.8 oz (75.2 kg)   SpO2 97%   BMI 22.49 kg/m     Wt Readings from Last 3 Encounters:  04/08/23 165 lb 12.8 oz (75.2 kg)  12/07/22 166 lb 6.4 oz (75.5 kg)  06/25/22 168 lb (76.2 kg)    Physical Exam  GEN: Thin, in no acute distress  Neck: no JVD, carotid bruits, or masses Cardiac:RRR; 2-3/6 systolic murmur LSB into carotids Respiratory:  clear to auscultation bilaterally, normal work of breathing GI: soft, nontender, nondistended, + BS Ext: without  cyanosis, clubbing, or edema, Good distal pulses bilaterally Neuro:  Alert and Oriented x 3 Psych: euthymic mood, full affect        EKGs/Labs/Other Test Reviewed:    EKG:  EKG is   ordered today.  The ekg ordered today demonstrates NSR with IRBBB unchanged  Recent Labs: 06/25/2022: BUN 17; Creatinine, Ser 1.02; Hemoglobin 13.8; Platelets 209; Potassium 4.1; Sodium 139   Recent Lipid Panel Recent Labs    03/07/23 0801  CHOL 160  TRIG 68  HDL 91  VLDL 14  LDLCALC 55     Prior CV Studies: CT CARDIAC SCORING (SELF PAY ONLY) 05/07/2022  Addendum 05/07/2022  4:52 PM ADDENDUM REPORT: 05/07/2022 16:50  ADDENDUM: OVER-READ INTERPRETATION  CT CHEST  The following report is an over-read performed by radiologist Dr. Lovie Chol [facility] on 05/07/2022. This over-read does not include interpretation of cardiac or coronary anatomy or pathology. The coronary calcium interpretation by the cardiologist is attached. Imaging of the chest is focused on cardiac structures and excludes much of the chest on CT.  COMPARISON:  None available  FINDINGS:  Cardiovascular: Please see dedicated report for cardiovascular details.  Mediastinum/Nodes: No sign of acute process or adenopathy in visualized portions of the mediastinum.  Lungs/Pleura: No consolidation. No pleural effusion. Basilar atelectasis. Airways are patent to the extent evaluated.  Upper Abdomen: Incidental imaging of upper abdominal contents without acute process.  Musculoskeletal: No acute bone finding. No destructive bone process. Spinal degenerative changes.  IMPRESSION:  No acute or significant extracardiac findings.  Aortic Atherosclerosis (ICD10-I70.0).   Electronically Signed By: Donzetta Kohut M.D. On: 05/07/2022 16:50  Narrative CLINICAL DATA:  Cardiovascular Disease Risk stratification  EXAM: Coronary Calcium Score  TECHNIQUE: A gated, non-contrast computed tomography scan  of the heart was performed using 3mm slice thickness. Axial images were analyzed on a dedicated workstation. Calcium scoring of the coronary arteries was performed using the Agatston method.  FINDINGS: Coronary Calcium Score:  Left main: 81.6  Left anterior descending artery: 312  Left circumflex artery: 0  Right coronary artery: 23.1  Total: 417  Percentile: 57th  Pericardium: Normal.  Non-cardiac: See separate report from [facility].  IMPRESSION: Coronary calcium score of 417. This was 57th percentile for age-, race-, and sex-matched controls.  RECOMMENDATIONS: Coronary artery calcium (CAC) score is a strong predictor of incident coronary heart disease (CHD) and provides predictive information beyond traditional  risk factors. CAC scoring is reasonable to use in the decision to withhold, postpone, or initiate statin therapy in intermediate-risk or selected borderline-risk asymptomatic adults (age 79-75 years and LDL-C >=70 to <190 mg/dL) who do not have diabetes or established atherosclerotic cardiovascular disease (ASCVD).* In intermediate-risk (10-year ASCVD risk >=7.5% to <20%) adults or selected borderline-risk (10-year ASCVD risk >=5% to <7.5%) adults in whom a CAC score is measured for the purpose of making a treatment decision the following recommendations have been made:  If CAC=0, it is reasonable to withhold statin therapy and reassess in 5 to 10 years, as long as higher risk conditions are absent (diabetes mellitus, family history of premature CHD in first degree relatives (males <55 years; females <65 years), cigarette smoking, or LDL >=190 mg/dL).  If CAC is 1 to 99, it is reasonable to initiate statin therapy for patients >=69 years of age.  If CAC is >=100 or >=75th percentile, it is reasonable to initiate statin therapy at any age.  Cardiology referral should be considered for patients with CAC scores >=400 or >=75th  percentile.  *2018 AHA/ACC/AACVPR/AAPA/ABC/ACPM/ADA/AGS/APhA/ASPC/NLA/PCNA Guideline on the Management of Blood Cholesterol: A Report of the American College of Cardiology/American Heart Association Task Force on Clinical Practice Guidelines. J Am Coll Cardiol. 2019;73(24):3168-3209.  Lennie Odor, MD  Electronically Signed: By: Lennie Odor M.D. On: 05/07/2022 16:07        Risk Assessment/Calculations/Metrics:              ASSESSMENT & PLAN:   No problem-specific Assessment & Plan notes found for this encounter.   Elevated Coronary calcium score 417 on CT 04/2022 with ongoing M-S chest pain that is unchanged. Will order Cardiac PET scan to evaluate further.  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   Medication Adjustments/Labs and Tests Ordered: Current medicines are reviewed at length with the patient today.  Concerns regarding medicines are outlined above.  Tests Ordered: Orders Placed This Encounter  Procedures   NM PET CT CARDIAC PERFUSION MULTI W/ABSOLUTE BLOODFLOW   EKG 12-Lead   ECHOCARDIOGRAM COMPLETE   Medication Changes: Meds ordered this encounter  Medications   aspirin EC 81 MG tablet    Sig: Take 1 tablet (81 mg total) by mouth every other day. Swallow whole.    Dispense:  45 tablet    Refill:  1   Signed, Jacolyn Reedy, PA-C  04/08/2023 3:00 PM    [facility] [address] [place], [place], [place]  [address] Phone: [phone]; Fax: [phone]

## 2023-04-08 ENCOUNTER — Other Ambulatory Visit: Payer: Self-pay

## 2023-04-08 ENCOUNTER — Ambulatory Visit: Payer: Medicare HMO | Attending: Physician Assistant | Admitting: Physician Assistant

## 2023-04-08 ENCOUNTER — Encounter: Payer: Self-pay | Admitting: Physician Assistant

## 2023-04-08 VITALS — BP 122/60 | HR 68 | Ht 72.0 in | Wt 165.8 lb

## 2023-04-08 DIAGNOSIS — I350 Nonrheumatic aortic (valve) stenosis: Secondary | ICD-10-CM

## 2023-04-08 DIAGNOSIS — E782 Mixed hyperlipidemia: Secondary | ICD-10-CM | POA: Diagnosis not present

## 2023-04-08 DIAGNOSIS — R011 Cardiac murmur, unspecified: Secondary | ICD-10-CM | POA: Diagnosis not present

## 2023-04-08 DIAGNOSIS — I2510 Atherosclerotic heart disease of native coronary artery without angina pectoris: Secondary | ICD-10-CM | POA: Diagnosis not present

## 2023-04-08 DIAGNOSIS — R079 Chest pain, unspecified: Secondary | ICD-10-CM | POA: Diagnosis not present

## 2023-04-08 MED ORDER — ASPIRIN 81 MG PO TBEC: 81.0000 mg | DELAYED_RELEASE_TABLET | ORAL | 1 refills | Status: AC

## 2023-04-08 NOTE — Patient Instructions (Addendum)
Medication Instructions:  Your physician has recommended you make the following change in your medication:   -Start Aspirin 81 mg once every other day  *If you need a refill on your cardiac medications before your next appointment, please call your pharmacy*   Lab Work: None If you have labs (blood work) drawn today and your tests are completely normal, you will receive your results only by: MyChart Message (if you have MyChart) OR A paper copy in the mail If you have any lab test that is abnormal or we need to change your treatment, we will call you to review the results.   Testing/Procedures: Cardiac Pet Scan  Your physician has requested that you have an echocardiogram. Echocardiography is a painless test that uses sound waves to create images of your heart. It provides your doctor with information about the size and shape of your heart and how well your heart's chambers and valves are working. This procedure takes approximately one hour. There are no restrictions for this procedure. Please do NOT wear cologne, perfume, aftershave, or lotions (deodorant is allowed). Please arrive 15 minutes prior to your appointment time.    Follow-Up: At [facility], you and your health needs are our priority.  As part of our continuing mission to provide you with exceptional heart care, we have created designated Provider Care Teams.  These Care Teams include your primary Cardiologist (physician) and Advanced Practice Providers (APPs -  Physician Assistants and Nurse Practitioners) who all work together to provide you with the care you need, when you need it.  We recommend signing up for the patient portal called "MyChart".  Sign up information is provided on this After Visit Summary.  MyChart is used to connect with patients for Virtual Visits (Telemedicine).  Patients are able to view lab/test results, encounter notes, upcoming appointments, etc.  Non-urgent messages can be sent to your  provider as well.   To learn more about what you can do with MyChart, go to [email or web address].    Your next appointment:   1 year(s)  Provider:   Charlton Haws, MD    Other Instructions Please get at least 150 minutes of exercise per week.   How to Prepare for Your Cardiac PET/CT Stress Test:  1. Please do not take these medications before your test:   Medications that may interfere with the cardiac pharmacological stress agent (ex. nitrates - including erectile dysfunction medications, isosorbide mononitrate, tamulosin or beta-blockers) the day of the exam. (Erectile dysfunction medication should be held for at least 72 hrs prior to test) Theophylline containing medications for 12 hours. Dipyridamole 48 hours prior to the test. Your remaining medications may be taken with water.  2. Nothing to eat or drink, except water, 3 hours prior to arrival time.   NO caffeine/decaffeinated products, or chocolate 12 hours prior to arrival.  3. NO perfume, cologne or lotion  4. Total time is 1 to 2 hours; you may want to bring reading material for the waiting time.  5. Please report to Radiology at the [facility] Main Entrance 30 minutes early for your test.  [address] [place], [place] [address]  Diabetic Preparation:  Hold oral medications. You may take NPH and Lantus insulin. Do not take Humalog or Humulin R (Regular Insulin) the day of your test. Check blood sugars prior to leaving the house. If able to eat breakfast prior to 3 hour fasting, you may take all medications, including your insulin, Do not worry  if you miss your breakfast dose of insulin - start at your next meal.  IF YOU THINK YOU MAY BE PREGNANT, OR ARE NURSING PLEASE INFORM THE TECHNOLOGIST.  In preparation for your appointment, medication and supplies will be purchased.  Appointment availability is limited, so if you need to cancel or reschedule, please call the Radiology Department at  [phone]  24 hours in advance to avoid a cancellation fee of $100.00  What to Expect After you Arrive:  Once you arrive and check in for your appointment, you will be taken to a preparation room within the Radiology Department.  A technologist or Nurse will obtain your medical history, verify that you are correctly prepped for the exam, and explain the procedure.  Afterwards,  an IV will be started in your arm and electrodes will be placed on your skin for EKG monitoring during the stress portion of the exam. Then you will be escorted to the PET/CT scanner.  There, staff will get you positioned on the scanner and obtain a blood pressure and EKG.  During the exam, you will continue to be connected to the EKG and blood pressure machines.  A small, safe amount of a radioactive tracer will be injected in your IV to obtain a series of pictures of your heart along with an injection of a stress agent.    After your Exam:  It is recommended that you eat a meal and drink a caffeinated beverage to counter act any effects of the stress agent.  Drink plenty of fluids for the remainder of the day and urinate frequently for the first couple of hours after the exam.  Your doctor will inform you of your test results within 7-10 business days.  For questions about your test or how to prepare for your test, please call: Rockwell Alexandria, Cardiac Imaging Nurse Navigator  Larey Brick, Cardiac Imaging Nurse Navigator Office: [phone]

## 2023-05-20 ENCOUNTER — Encounter (HOSPITAL_COMMUNITY): Payer: Self-pay

## 2023-05-20 NOTE — Addendum Note (Signed)
Addended by: Leonides Schanz C on: 05/20/2023 01:28 PM   Modules accepted: Orders

## 2023-05-21 ENCOUNTER — Ambulatory Visit (HOSPITAL_COMMUNITY)
Admission: RE | Admit: 2023-05-21 | Discharge: 2023-05-21 | Disposition: A | Discharge status: Home / Self Care | Payer: Medicare HMO | Source: Ambulatory Visit | Attending: Physician Assistant | Admitting: Physician Assistant

## 2023-05-21 ENCOUNTER — Telehealth (HOSPITAL_COMMUNITY): Payer: Self-pay | Admitting: Emergency Medicine

## 2023-05-21 ENCOUNTER — Telehealth (HOSPITAL_COMMUNITY): Payer: Self-pay | Admitting: *Deleted

## 2023-05-21 DIAGNOSIS — R011 Cardiac murmur, unspecified: Secondary | ICD-10-CM | POA: Diagnosis present

## 2023-05-21 DIAGNOSIS — I350 Nonrheumatic aortic (valve) stenosis: Secondary | ICD-10-CM | POA: Insufficient documentation

## 2023-05-21 LAB — ECHOCARDIOGRAM COMPLETE
AR max vel: 1.78 cm2
AV Area VTI: 1.75 cm2
AV Area mean vel: 1.77 cm2
AV Mean grad: 12 mmHg
AV Peak grad: 20.4 mmHg
Ao pk vel: 2.26 m/s
Area-P 1/2: 2.69 cm2
S' Lateral: 2.2 cm

## 2023-05-21 NOTE — Addendum Note (Signed)
Addended by: Dyann Kief on: 05/21/2023 08:22 AM   Modules accepted: Orders

## 2023-05-21 NOTE — Telephone Encounter (Signed)
Reaching out to patient to offer assistance regarding upcoming cardiac imaging study; pt verbalizes understanding of appt date/time, parking situation and where to check in, pre-test NPO status and medications ordered, and verified current allergies; name and call back number provided for further questions should they arise Sara Wallace RN Navigator Cardiac Imaging [facility] [phone] office [phone] cell 

## 2023-05-21 NOTE — Telephone Encounter (Signed)
Attempted to call patient regarding upcoming cardiac PET appointment. Left message on voicemail with name and callback number  Larey Brick RN Navigator Cardiac Imaging [facility] [phone] Office [phone] Cell  Reminder to avoid caffeine 12 hours prior to appointment.

## 2023-05-21 NOTE — Progress Notes (Signed)
*  PRELIMINARY RESULTS* Echocardiogram 2D Echocardiogram has been performed.  Stacey Drain 05/21/2023, 9:22 AM

## 2023-05-22 ENCOUNTER — Encounter (HOSPITAL_COMMUNITY)
Admission: RE | Admit: 2023-05-22 | Discharge: 2023-05-22 | Disposition: A | Discharge status: Home / Self Care | Payer: Medicare HMO | Source: Ambulatory Visit | Attending: Physician Assistant | Admitting: Physician Assistant

## 2023-05-22 DIAGNOSIS — I2510 Atherosclerotic heart disease of native coronary artery without angina pectoris: Secondary | ICD-10-CM | POA: Insufficient documentation

## 2023-05-22 DIAGNOSIS — R079 Chest pain, unspecified: Secondary | ICD-10-CM | POA: Insufficient documentation

## 2023-05-22 LAB — NM PET CT CARDIAC PERFUSION MULTI W/ABSOLUTE BLOODFLOW
LV dias vol: 80 mL (ref 62–150)
LV sys vol: 37 mL
MBFR: 3.08
Nuc Rest EF: 54 %
Nuc Stress EF: 67 %
Rest MBF: 0.76 ml/g/min
Rest Nuclear Isotope Dose: 19.3 mCi
ST Depression (mm): 0 mm
Stress MBF: 2.34 ml/g/min
Stress Nuclear Isotope Dose: 16.6 mCi

## 2023-05-22 MED ORDER — REGADENOSON 0.4 MG/5ML IV SOLN
0.4000 mg | Freq: Once | INTRAVENOUS | Status: AC
  Administered 2023-05-22: 0.4 mg via INTRAVENOUS

## 2023-05-22 MED ORDER — REGADENOSON 0.4 MG/5ML IV SOLN
INTRAVENOUS | Status: AC
  Filled 2023-05-22: qty 5

## 2023-05-22 MED ORDER — RUBIDIUM RB82 GENERATOR (RUBYFILL)
19.3200 | PACK | Freq: Once | INTRAVENOUS | Status: AC
  Administered 2023-05-22: 19.32 via INTRAVENOUS

## 2023-05-22 MED ORDER — RUBIDIUM RB82 GENERATOR (RUBYFILL)
19.9200 | PACK | Freq: Once | INTRAVENOUS | Status: AC
  Administered 2023-05-22: 19.92 via INTRAVENOUS

## 2023-09-18 ENCOUNTER — Other Ambulatory Visit: Payer: Self-pay

## 2023-09-18 DIAGNOSIS — E782 Mixed hyperlipidemia: Secondary | ICD-10-CM

## 2023-09-24 ENCOUNTER — Other Ambulatory Visit (HOSPITAL_COMMUNITY)
Admission: RE | Admit: 2023-09-24 | Discharge: 2023-09-24 | Disposition: A | Discharge status: Home / Self Care | Payer: Medicare HMO | Source: Ambulatory Visit | Attending: Physician Assistant | Admitting: Physician Assistant

## 2023-09-24 ENCOUNTER — Other Ambulatory Visit: Payer: Self-pay

## 2023-09-24 DIAGNOSIS — R079 Chest pain, unspecified: Secondary | ICD-10-CM

## 2023-09-24 DIAGNOSIS — R931 Abnormal findings on diagnostic imaging of heart and coronary circulation: Secondary | ICD-10-CM

## 2023-09-24 DIAGNOSIS — E782 Mixed hyperlipidemia: Secondary | ICD-10-CM | POA: Insufficient documentation

## 2023-09-24 DIAGNOSIS — E785 Hyperlipidemia, unspecified: Secondary | ICD-10-CM

## 2023-09-24 LAB — LIPID PANEL
Cholesterol: 182 mg/dL (ref 0–200)
HDL: 97 mg/dL (ref >40)
LDL Cholesterol: 76 mg/dL (ref 0–99)
Total CHOL/HDL Ratio: 1.9 {ratio}
Triglycerides: 43 mg/dL (ref <150)
VLDL: 9 mg/dL (ref 0–40)

## 2023-09-24 MED ORDER — REPATHA SURECLICK 140 MG/ML ~~LOC~~ SOAJ: 1.0000 mL | SUBCUTANEOUS | 3 refills | Status: DC

## 2024-04-03 ENCOUNTER — Encounter: Payer: Self-pay | Admitting: Student

## 2024-04-03 ENCOUNTER — Ambulatory Visit: Attending: Student | Admitting: Student

## 2024-04-03 VITALS — BP 112/64 | HR 59 | Ht 72.0 in | Wt 167.0 lb

## 2024-04-03 DIAGNOSIS — I350 Nonrheumatic aortic (valve) stenosis: Secondary | ICD-10-CM

## 2024-04-03 DIAGNOSIS — Z87898 Personal history of other specified conditions: Secondary | ICD-10-CM

## 2024-04-03 DIAGNOSIS — R931 Abnormal findings on diagnostic imaging of heart and coronary circulation: Secondary | ICD-10-CM | POA: Diagnosis not present

## 2024-04-03 DIAGNOSIS — E785 Hyperlipidemia, unspecified: Secondary | ICD-10-CM

## 2024-04-03 MED ORDER — REPATHA SURECLICK 140 MG/ML ~~LOC~~ SOAJ
1.0000 mL | SUBCUTANEOUS | 3 refills | Status: AC
Start: 2024-04-03 — End: ?

## 2024-04-03 NOTE — Patient Instructions (Signed)
 Medication Instructions:  Your physician recommends that you continue on your current medications as directed. Please refer to the Current Medication list given to you today.  *If you need a refill on your cardiac medications before your next appointment, please call your pharmacy*  Lab Work: NONE   If you have labs (blood work) drawn today and your tests are completely normal, you will receive your results only by: MyChart Message (if you have MyChart) OR A paper copy in the mail If you have any lab test that is abnormal or we need to change your treatment, we will call you to review the results.  Testing/Procedures: Your physician has requested that you have an echocardiogram. Echocardiography is a painless test that uses sound waves to create images of your heart. It provides your doctor with information about the size and shape of your heart and how well your heart's chambers and valves are working. This procedure takes approximately one hour. There are no restrictions for this procedure. Please do NOT wear cologne, perfume, aftershave, or lotions (deodorant is allowed). Please arrive 15 minutes prior to your appointment time.  Please note: We ask at that you not bring children with you during ultrasound (echo/ vascular) testing. Due to room size and safety concerns, children are not allowed in the ultrasound rooms during exams. Our front office staff cannot provide observation of children in our lobby area while testing is being conducted. An adult accompanying a patient to their appointment will only be allowed in the ultrasound room at the discretion of the ultrasound technician under special circumstances. We apologize for any inconvenience.   Follow-Up: At [facility], you and your health needs are our priority.  As part of our continuing mission to provide you with exceptional heart care, our providers are all part of one team.  This team includes your primary Cardiologist  (physician) and Advanced Practice Providers or APPs (Physician Assistants and Nurse Practitioners) who all work together to provide you with the care you need, when you need it.  Your next appointment:   1 year(s)  Provider:   You may see Janelle Mediate, MD or one of the following Advanced Practice Providers on your designated Care Team:   Woodfin Hays, PA-C  [place], [place] Theotis Flake, [place]     We recommend signing up for the patient portal called MyChart.  Sign up information is provided on this After Visit Summary.  MyChart is used to connect with patients for Virtual Visits (Telemedicine).  Patients are able to view lab/test results, encounter notes, upcoming appointments, etc.  Non-urgent messages can be sent to your provider as well.   To learn more about what you can do with MyChart, go to [email or web address].   Other Instructions Thank you for choosing [facility]!

## 2024-04-03 NOTE — Progress Notes (Signed)
 Cardiology Office Note    Date:  04/03/2024  ID:  Walter Taylor, DOB Oct 04, 1946, MRN [id number] Cardiologist: Janelle Mediate, MD    History of Present Illness:    Walter Taylor is a 78 y.o. male with past medical history of atypical chest pain (low-risk ETT in 02/2021 and cardiac PET in 04/2023 showing coronary calcification but no ischemia and a low-risk study), AS, COPD, HLD and hypothyroidism who presents to the office today for annual follow-up.  He was last examined by Theotis Flake, PA in 03/2023 and reported having chest wall pain which was worse when taking a deep breath and was active at baseline and denied any specific anginal symptoms. Given the timeframe since most recent evaluation, a cardiac PET scan was recommended for further assessment and an updated echocardiogram was obtained given his murmur on examination. He was continued on ASA 81 mg daily and Repatha  as he had previously been intolerant to statin therapy. His echocardiogram showed a preserved EF of 60 to 65% with no regional wall motion abnormalities. He did have mild LVH, normal RV function, trivial MR and mild AS. His cardiac PET showed moderate coronary calcification but global myocardial blood flow reserve was normal and there was no evidence of ischemia. The study was overall low-risk.  In talking the patient today, he reports overall doing well since his last office visit. 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 He denies any chest pain or progressive dyspnea on exertion with this. No specific palpitations, orthopnea, PND or pitting edema. Remains on Repatha  for hyperlipidemia given his prior statin intolerance. Says that he does not take ASA at this time due to easy bruising. 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  Studies Reviewed:   EKG: EKG is ordered today and demonstrates:   EKG Interpretation Date/Time:  Friday April 03 2024 13:03:29 EDT Ventricular Rate:  65 PR Interval:  168 QRS Duration:  100 QT Interval:  408 QTC Calculation: 424 R Axis:   36  Text Interpretation: Normal sinus rhythm Incomplete right bundle branch block No acute changes Confirmed by Woodfin Hays ([id number]) on 04/03/2024 1:05:06 PM       Echocardiogram: 04/2023 IMPRESSIONS     1. Left ventricular ejection fraction, by estimation, is 60 to 65%. The  left ventricle has normal function. The left ventricle has no regional  wall motion abnormalities. There is mild left ventricular hypertrophy.  Left ventricular diastolic parameters  were normal.   2. Right ventricular systolic function is normal. The right ventricular  size is normal. Tricuspid regurgitation signal is inadequate for assessing  PA pressure.   3. The mitral valve is normal in structure. Trivial mitral valve  regurgitation. No evidence of mitral stenosis.   4. The aortic valve is tricuspid. There is severe calcifcation of the  aortic valve. There is severe thickening of the aortic valve. Aortic valve  regurgitation is not visualized. Mild aortic valve stenosis.   5. The inferior vena cava is dilated in size with >50% respiratory  variability, suggesting right atrial pressure of 8 mmHg.   Cardiac PET: 04/2023   LV perfusion is normal. There is no evidence of ischemia. There is no evidence of infarction.   Rest left ventricular function is normal.  Rest EF: 54%. Stress left ventricular function is normal. Stress EF: 67%. End diastolic cavity size is normal. End systolic cavity size is normal.   Myocardial blood flow was computed to be 0.36ml/g/min at rest and 2.34ml/g/min at stress. Global myocardial blood flow reserve was 3.08 and was normal.   Coronary calcium was present on the attenuation correction CT images. Moderate coronary  calcifications were present. Coronary calcifications were present in the left anterior descending artery, left circumflex artery and right coronary artery distribution(s).   The study is normal. The study is low risk.   Physical Exam:   VS:  BP 112/64 (BP Location: Left Arm, Cuff Size: Normal)   Pulse (!) 59   Ht 6' (1.829 m)   Wt 167 lb (75.8 kg)   SpO2 98%   BMI 22.65 kg/m    Wt Readings from Last 3 Encounters:  04/03/24 167 lb (75.8 kg)  04/08/23 165 lb 12.8 oz (75.2 kg)  12/07/22 166 lb 6.4 oz (75.5 kg)     GEN: Pleasant male appearing in no acute distress NECK: No JVD; No carotid bruits CARDIAC: RRR, 2/6 systolic murmur along sternal border.  RESPIRATORY:  Clear to auscultation without rales, wheezing or rhonchi  ABDOMEN: Appears non-distended. No obvious abdominal masses. EXTREMITIES: No clubbing or cyanosis. No pitting edema.  Distal pedal pulses are 2+ bilaterally.   Assessment and Plan:   1. History of Chest Pain/Coronary Calcification by CT - He had a low-risk ETT in 02/2021 and cardiac PET in 04/2023 showed coronary calcification but no evidence of ischemia and was overall a low-risk study. - He remains very active at baseline and denies any recent anginal symptoms. Continue with risk factor modification. We reviewed that he could try taking ASA every other day if able to tolerate. Remains on Repatha  as outlined below.  2. Aortic valve stenosis, etiology of cardiac valve disease unspecified - This was mild by echocardiogram in 04/2023. Would plan for follow-up imaging prior to his next visit in 1 year for reassessment.  3. Hyperlipidemia LDL goal <70 - FLP in 09/2023 showed his LDL was at 76. He has been intolerant to statins and remains on Repatha .  Disposition: Repeat echo in 1 year with office visit afterwards.   Signed, Dorma Gash, PA-C

## 2025-03-22 ENCOUNTER — Other Ambulatory Visit (HOSPITAL_COMMUNITY)
